# Patient Record
Sex: Male | Born: 1990 | Race: Black or African American | Hispanic: No | Smoking: Former smoker
Health system: Southern US, Community
[De-identification: ages and names within clinical notes are randomized; demographics above are authoritative.]

## PROBLEM LIST (undated history)

## (undated) DIAGNOSIS — J189 Pneumonia, unspecified organism: Secondary | ICD-10-CM

## (undated) DIAGNOSIS — E119 Type 2 diabetes mellitus without complications: Secondary | ICD-10-CM

---

## 2018-10-21 ENCOUNTER — Other Ambulatory Visit: Payer: Self-pay

## 2018-10-21 ENCOUNTER — Encounter (HOSPITAL_BASED_OUTPATIENT_CLINIC_OR_DEPARTMENT_OTHER): Payer: Self-pay | Admitting: Emergency Medicine

## 2018-10-21 ENCOUNTER — Emergency Department (HOSPITAL_BASED_OUTPATIENT_CLINIC_OR_DEPARTMENT_OTHER)
Admission: EM | Admit: 2018-10-21 | Discharge: 2018-10-21 | Disposition: A | Discharge status: Home / Self Care | Payer: Self-pay | Attending: Emergency Medicine | Admitting: Emergency Medicine

## 2018-10-21 DIAGNOSIS — F17290 Nicotine dependence, other tobacco product, uncomplicated: Secondary | ICD-10-CM | POA: Insufficient documentation

## 2018-10-21 DIAGNOSIS — L509 Urticaria, unspecified: Secondary | ICD-10-CM | POA: Insufficient documentation

## 2018-10-21 HISTORY — DX: Pneumonia, unspecified organism: J18.9

## 2018-10-21 MED ORDER — SODIUM CHLORIDE 0.9 % IV SOLN
1.0000 g | Freq: Once | INTRAVENOUS | Status: AC
  Administered 2018-10-21: 1 g via INTRAVENOUS
  Filled 2018-10-21: qty 10

## 2018-10-21 MED ORDER — FAMOTIDINE IN NACL 20-0.9 MG/50ML-% IV SOLN
INTRAVENOUS | Status: AC
  Filled 2018-10-21: qty 50

## 2018-10-21 MED ORDER — SODIUM CHLORIDE 0.9 % IV SOLN
INTRAVENOUS | Status: DC | PRN
  Administered 2018-10-21: 100 mL via INTRAVENOUS

## 2018-10-21 MED ORDER — DIPHENHYDRAMINE HCL 50 MG/ML IJ SOLN
INTRAMUSCULAR | Status: AC
  Filled 2018-10-21: qty 1

## 2018-10-21 MED ORDER — DIPHENHYDRAMINE HCL 50 MG/ML IJ SOLN
50.0000 mg | Freq: Once | INTRAMUSCULAR | Status: AC
  Administered 2018-10-21: 50 mg via INTRAVENOUS

## 2018-10-21 MED ORDER — FAMOTIDINE IN NACL 20-0.9 MG/50ML-% IV SOLN
20.0000 mg | Freq: Once | INTRAVENOUS | Status: AC
  Administered 2018-10-21: 20 mg via INTRAVENOUS

## 2018-10-21 MED ORDER — DIPHENHYDRAMINE HCL 25 MG PO TABS: 50.0000 mg | ORAL_TABLET | Freq: Four times a day (QID) | ORAL | 0 refills | Status: DC | PRN

## 2018-10-21 NOTE — ED Triage Notes (Signed)
Pt reports "bumps" to arms and feet onset tonight. Reports he was diagnosed with PNA today at [facility] Mentioned the same issue at that visit but was told it was hives. Gwenith Daily not filled his RX from today yet.

## 2018-10-21 NOTE — ED Provider Notes (Signed)
[facility] Provider Note: [name], MD, FACEP  CSN: [id number] MRN: [id number] ARRIVAL: 10/21/18 at 0124 ROOM: MH11/MH11   CHIEF COMPLAINT  Rash   HISTORY OF PRESENT ILLNESS  10/21/18 1:35 AM George Sanders is a 28 y.o. male who developed itching yesterday afternoon about 12:45 PM.  He subsequently developed an urticarial rash.  This is most prominent on his abdominal wall.  He was seen at [facility] ED yesterday for chest pain and sore throat.  CT scan showed 6 multifocal pneumonia and he was prescribed doxycycline.  He states he has not gotten that filled.  He asked about the rash and was told it was hives.  He said he was not treated for the hives.  He is now complaining also of swelling and pain in his hands and feet.   Past Medical History:  Diagnosis Date  . PNA (pneumonia)     History reviewed. No pertinent surgical history.  History reviewed. No pertinent family history.  Social History   Tobacco Use  . Smoking status: Current Every Day Smoker    Types: Cigars  . Smokeless tobacco: Never Used  Substance Use Topics  . Alcohol use: Yes  . Drug use: Not Currently    Prior to Admission medications   Medication Sig Start Date End Date Taking? Authorizing Provider  doxycycline (VIBRAMYCIN) 100 MG capsule Take by mouth. 10/20/18 10/27/18 Yes [provider]    Allergies Patient has no known allergies.   REVIEW OF SYSTEMS  Negative except as noted here or in the History of Present Illness.   PHYSICAL EXAMINATION  Initial Vital Signs Blood pressure (!) 164/88, pulse 84, temperature 99.3 F (37.4 C), temperature source Oral, SpO2 95 %.  Examination General: Well-developed, well-nourished male in no acute distress; appearance consistent with age of record HENT: normocephalic; atraumatic; no pharyngeal erythema or exudate Eyes: Normal appearance Neck: supple Heart: regular rate and rhythm Lungs: clear to auscultation  bilaterally Abdomen: soft; nondistended; nontender; bowel sounds present Extremities: No deformity; full range of motion; mild edema of hands and feet without erythema or warmth Neurologic: Awake, alert and oriented; motor function intact in all extremities and symmetric; no facial droop Skin: Warm and dry; urticarial rash primarily of abdomen Psychiatric: Normal mood and affect   RESULTS  Summary of this visit's results, reviewed by myself:   EKG Interpretation  Date/Time:    Ventricular Rate:    PR Interval:    QRS Duration:   QT Interval:    QTC Calculation:   R Axis:     Text Interpretation:        Laboratory Studies: No results found for this or any previous visit (from the past 24 hour(s)). Imaging Studies: No results found.  ED COURSE and MDM  Nursing notes and initial vitals signs, including pulse oximetry, reviewed.  Vitals:   10/21/18 0134 10/21/18 0137  BP: (!) 164/88   Pulse: 84   Resp: (!) 25   Temp: 99.3 F (37.4 C)   TempSrc: Oral   SpO2: 95%   Weight:  136.1 kg  Height:  6' (1.829 m)   3:25 AM Itching resolved, hives fading after IV Benadryl and Pepcid.  Patient given 1 g of Rocephin IV to initiate treatment of his earlier diagnosed community-acquired pneumonia.  He was encouraged to fill the doxycycline prescription.  PROCEDURES    ED DIAGNOSES     ICD-10-CM   1. Urticaria L50.9        [name],  [name], MD 10/21/18 [phone]

## 2018-10-21 NOTE — ED Notes (Signed)
Hives improved. Pt reports decrease in itching.

## 2019-04-29 ENCOUNTER — Inpatient Hospital Stay (HOSPITAL_BASED_OUTPATIENT_CLINIC_OR_DEPARTMENT_OTHER)
Admission: EM | Admit: 2019-04-29 | Discharge: 2019-05-02 | DRG: 638 | Disposition: A | Discharge status: Home / Self Care | Payer: Self-pay | Attending: Internal Medicine | Admitting: Internal Medicine

## 2019-04-29 ENCOUNTER — Encounter (HOSPITAL_BASED_OUTPATIENT_CLINIC_OR_DEPARTMENT_OTHER): Payer: Self-pay

## 2019-04-29 ENCOUNTER — Inpatient Hospital Stay (HOSPITAL_BASED_OUTPATIENT_CLINIC_OR_DEPARTMENT_OTHER): Payer: Self-pay

## 2019-04-29 ENCOUNTER — Emergency Department (HOSPITAL_BASED_OUTPATIENT_CLINIC_OR_DEPARTMENT_OTHER): Payer: Self-pay

## 2019-04-29 ENCOUNTER — Other Ambulatory Visit: Payer: Self-pay

## 2019-04-29 DIAGNOSIS — R9431 Abnormal electrocardiogram [ECG] [EKG]: Secondary | ICD-10-CM | POA: Diagnosis present

## 2019-04-29 DIAGNOSIS — R030 Elevated blood-pressure reading, without diagnosis of hypertension: Secondary | ICD-10-CM | POA: Diagnosis present

## 2019-04-29 DIAGNOSIS — Z833 Family history of diabetes mellitus: Secondary | ICD-10-CM

## 2019-04-29 DIAGNOSIS — R079 Chest pain, unspecified: Secondary | ICD-10-CM | POA: Diagnosis present

## 2019-04-29 DIAGNOSIS — K760 Fatty (change of) liver, not elsewhere classified: Secondary | ICD-10-CM | POA: Diagnosis present

## 2019-04-29 DIAGNOSIS — E1110 Type 2 diabetes mellitus with ketoacidosis without coma: Principal | ICD-10-CM | POA: Diagnosis present

## 2019-04-29 DIAGNOSIS — R7989 Other specified abnormal findings of blood chemistry: Secondary | ICD-10-CM | POA: Diagnosis present

## 2019-04-29 DIAGNOSIS — N5089 Other specified disorders of the male genital organs: Secondary | ICD-10-CM | POA: Diagnosis present

## 2019-04-29 DIAGNOSIS — E1310 Other specified diabetes mellitus with ketoacidosis without coma: Secondary | ICD-10-CM

## 2019-04-29 DIAGNOSIS — Z79899 Other long term (current) drug therapy: Secondary | ICD-10-CM

## 2019-04-29 DIAGNOSIS — K5900 Constipation, unspecified: Secondary | ICD-10-CM | POA: Diagnosis present

## 2019-04-29 DIAGNOSIS — R945 Abnormal results of liver function studies: Secondary | ICD-10-CM | POA: Diagnosis present

## 2019-04-29 DIAGNOSIS — I10 Essential (primary) hypertension: Secondary | ICD-10-CM | POA: Diagnosis present

## 2019-04-29 DIAGNOSIS — N492 Inflammatory disorders of scrotum: Secondary | ICD-10-CM | POA: Diagnosis present

## 2019-04-29 DIAGNOSIS — Z20828 Contact with and (suspected) exposure to other viral communicable diseases: Secondary | ICD-10-CM | POA: Diagnosis present

## 2019-04-29 DIAGNOSIS — Z6841 Body Mass Index (BMI) 40.0 and over, adult: Secondary | ICD-10-CM

## 2019-04-29 DIAGNOSIS — R109 Unspecified abdominal pain: Secondary | ICD-10-CM | POA: Diagnosis present

## 2019-04-29 DIAGNOSIS — Z87891 Personal history of nicotine dependence: Secondary | ICD-10-CM

## 2019-04-29 DIAGNOSIS — Z7982 Long term (current) use of aspirin: Secondary | ICD-10-CM

## 2019-04-29 DIAGNOSIS — E782 Mixed hyperlipidemia: Secondary | ICD-10-CM | POA: Diagnosis present

## 2019-04-29 DIAGNOSIS — E119 Type 2 diabetes mellitus without complications: Secondary | ICD-10-CM

## 2019-04-29 LAB — POCT I-STAT EG7
Acid-base deficit: 13 mmol/L — ABNORMAL HIGH (ref 0.0–2.0)
Bicarbonate: 13 mmol/L — ABNORMAL LOW (ref 20.0–28.0)
Calcium, Ion: 1.27 mmol/L (ref 1.15–1.40)
HCT: 49 % (ref 39.0–52.0)
Hemoglobin: 16.7 g/dL (ref 13.0–17.0)
O2 Saturation: 64 %
Potassium: 4.1 mmol/L (ref 3.5–5.1)
Sodium: 136 mmol/L (ref 135–145)
TCO2: 14 mmol/L — ABNORMAL LOW (ref 22–32)
pCO2, Ven: 31.5 mmHg — ABNORMAL LOW (ref 44.0–60.0)
pH, Ven: 7.225 — ABNORMAL LOW (ref 7.250–7.430)
pO2, Ven: 39 mmHg (ref 32.0–45.0)

## 2019-04-29 LAB — CBC WITH DIFFERENTIAL/PLATELET
Abs Immature Granulocytes: 0.03 10*3/uL (ref 0.00–0.07)
Basophils Absolute: 0 10*3/uL (ref 0.0–0.1)
Basophils Relative: 0 %
Eosinophils Absolute: 0 10*3/uL (ref 0.0–0.5)
Eosinophils Relative: 0 %
HCT: 48 % (ref 39.0–52.0)
Hemoglobin: 15 g/dL (ref 13.0–17.0)
Immature Granulocytes: 0 %
Lymphocytes Relative: 28 %
Lymphs Abs: 1.9 10*3/uL (ref 0.7–4.0)
MCH: 26.8 pg (ref 26.0–34.0)
MCHC: 31.3 g/dL (ref 30.0–36.0)
MCV: 85.7 fL (ref 80.0–100.0)
Monocytes Absolute: 0.6 10*3/uL (ref 0.1–1.0)
Monocytes Relative: 8 %
Neutro Abs: 4.3 10*3/uL (ref 1.7–7.7)
Neutrophils Relative %: 64 %
Platelets: 200 10*3/uL (ref 150–400)
RBC: 5.6 MIL/uL (ref 4.22–5.81)
RDW: 14.5 % (ref 11.5–15.5)
WBC: 6.9 10*3/uL (ref 4.0–10.5)
nRBC: 0 % (ref 0.0–0.2)

## 2019-04-29 LAB — COMPREHENSIVE METABOLIC PANEL
ALT: 59 U/L — ABNORMAL HIGH (ref 0–44)
AST: 71 U/L — ABNORMAL HIGH (ref 15–41)
Albumin: 4.5 g/dL (ref 3.5–5.0)
Alkaline Phosphatase: 68 U/L (ref 38–126)
Anion gap: 19 — ABNORMAL HIGH (ref 5–15)
BUN: 8 mg/dL (ref 6–20)
CO2: 12 mmol/L — ABNORMAL LOW (ref 22–32)
Calcium: 9.4 mg/dL (ref 8.9–10.3)
Chloride: 101 mmol/L (ref 98–111)
Creatinine, Ser: 1.02 mg/dL (ref 0.61–1.24)
GFR calc Af Amer: >60 mL/min (ref >60)
GFR calc non Af Amer: >60 mL/min (ref >60)
Glucose, Bld: 314 mg/dL — ABNORMAL HIGH (ref 70–99)
Potassium: 4.4 mmol/L (ref 3.5–5.1)
Sodium: 132 mmol/L — ABNORMAL LOW (ref 135–145)
Total Bilirubin: 1.2 mg/dL (ref 0.3–1.2)
Total Protein: 9.4 g/dL — ABNORMAL HIGH (ref 6.5–8.1)

## 2019-04-29 LAB — CBG MONITORING, ED
Glucose-Capillary: 244 mg/dL — ABNORMAL HIGH (ref 70–99)
Glucose-Capillary: 252 mg/dL — ABNORMAL HIGH (ref 70–99)
Glucose-Capillary: 259 mg/dL — ABNORMAL HIGH (ref 70–99)
Glucose-Capillary: 264 mg/dL — ABNORMAL HIGH (ref 70–99)
Glucose-Capillary: 320 mg/dL — ABNORMAL HIGH (ref 70–99)

## 2019-04-29 LAB — LIPASE, BLOOD: Lipase: 22 U/L (ref 11–51)

## 2019-04-29 LAB — LACTIC ACID, PLASMA
Lactic Acid, Venous: 1.6 mmol/L (ref 0.5–1.9)
Lactic Acid, Venous: 1.7 mmol/L (ref 0.5–1.9)

## 2019-04-29 LAB — SARS CORONAVIRUS 2 BY RT PCR (HOSPITAL ORDER, PERFORMED IN ~~LOC~~ HOSPITAL LAB): SARS Coronavirus 2: NEGATIVE

## 2019-04-29 MED ORDER — CLINDAMYCIN PHOSPHATE 600 MG/50ML IV SOLN
600.0000 mg | Freq: Once | INTRAVENOUS | Status: AC
  Administered 2019-04-29: 600 mg via INTRAVENOUS
  Filled 2019-04-29: qty 50

## 2019-04-29 MED ORDER — POTASSIUM CHLORIDE 10 MEQ/100ML IV SOLN
10.0000 meq | INTRAVENOUS | Status: AC
  Administered 2019-04-29 (×2): 10 meq via INTRAVENOUS
  Filled 2019-04-29: qty 100

## 2019-04-29 MED ORDER — SODIUM CHLORIDE 0.9 % IV SOLN
INTRAVENOUS | Status: DC
  Administered 2019-04-29: 20:00:00 via INTRAVENOUS

## 2019-04-29 MED ORDER — ONDANSETRON HCL 4 MG/2ML IJ SOLN
4.0000 mg | Freq: Once | INTRAMUSCULAR | Status: AC
  Administered 2019-04-29: 4 mg via INTRAVENOUS
  Filled 2019-04-29: qty 2

## 2019-04-29 MED ORDER — SODIUM CHLORIDE 0.9 % IV BOLUS
1000.0000 mL | Freq: Once | INTRAVENOUS | Status: AC
  Administered 2019-04-30: 1000 mL via INTRAVENOUS

## 2019-04-29 MED ORDER — INSULIN REGULAR(HUMAN) IN NACL 100-0.9 UT/100ML-% IV SOLN
INTRAVENOUS | Status: DC
  Administered 2019-04-29: 2 [IU]/h via INTRAVENOUS
  Administered 2019-04-30: 11.7 [IU]/h via INTRAVENOUS
  Filled 2019-04-29 (×3): qty 100

## 2019-04-29 MED ORDER — FENTANYL CITRATE (PF) 100 MCG/2ML IJ SOLN
100.0000 ug | Freq: Once | INTRAMUSCULAR | Status: AC
  Administered 2019-04-29: 100 ug via INTRAVENOUS
  Filled 2019-04-29: qty 2

## 2019-04-29 MED ORDER — SODIUM CHLORIDE 0.9 % IV BOLUS
1000.0000 mL | Freq: Once | INTRAVENOUS | Status: AC
  Administered 2019-04-29: 1000 mL via INTRAVENOUS

## 2019-04-29 NOTE — ED Triage Notes (Addendum)
Pt c/o constipation-last BM 4 days ago--c/o weakness and HA x 4-5 days-also c/o "cyst to my genital area" x 2 days-NAD-to triage in w/c

## 2019-04-29 NOTE — Care Management (Signed)
This is a no charge note  Transfer from [facility] per Dr. Rex Kras  28 year old man without significant past medical history, who presents with nausea, vomiting, upper abdominal pain, generalized weakness, constipation, headache.  Found to have new onset of diabetes and DKA with blood sugar 314, bicarbonate 12, anion gap of 19.  Insulin drip was started.  Patient also has scrotum abscess which is actively draining.  Patient was given 1 dose of clindamycin.  VBG showed a pH of 7.225, CO2 31.5, O2 39.  Abnormal liver function (ALP 68, AST 71, ALT 59, total bilirubin 1.2).  lactic acid 1.6, WBC 6.7, pending COVID-19 test, creatinine 1.02, BUN 8, temperature normal, blood pressure 118/74, heart rate 126, RR 24, oxygen saturation 99% on room air.  Scrotum ultrasound and right upper quadrant ultrasound were ordered by EDP, pending results. Pt is admitted to SDU as inpt.   Please call manager of [facility] at [phone] when pt arrives to floor   Ivor Costa, MD  [facility]   If 7PM-7AM, please contact night-coverage [email or web address] Password [facility] 04/29/2019, 7:57 PM

## 2019-04-29 NOTE — ED Provider Notes (Signed)
[facility] EMERGENCY DEPARTMENT Provider Note   CSN: [id number] Arrival date & time: 04/29/19  1625     History   Chief Complaint Chief Complaint  Patient presents with  . Multiple c/o    HPI George Sanders. is a 28 y.o. male.     28yo M w/ h/o obesity who p/w multiple complaints. Pt reports 4 days of central abd pain, constant. Improves w/ gingerale. He has had N/V, last vomiting 2 days ago. He reports constipation with last BM 4 days ago. He reports headache and feeling weak x 4-5 days. No urinary symptoms, fever, cough/cold symptoms, or sick contacts. Of note, his mom checked his BG at home and it was high in the 300s so she gave him unknown amount of unknown type of insulin. He denies having diagnosis of diabetes.  He does admit to polyuria and polydipsia.     Past Medical History:  Diagnosis Date  . PNA (pneumonia)     Patient Active Problem List   Diagnosis Date Noted  . DKA (diabetic ketoacidoses) (HCC) 04/29/2019  . New onset type 2 diabetes mellitus (HCC) 04/29/2019  . Abscess of scrotum 04/29/2019  . Abnormal LFTs 04/29/2019  . Abdominal pain 04/29/2019    History reviewed. No pertinent surgical history.      Home Medications    Prior to Admission medications   Medication Sig Start Date End Date Taking? Authorizing Provider  diphenhydrAMINE (BENADRYL) 25 MG tablet Take 2 tablets (50 mg total) by mouth every 6 (six) hours as needed for itching. 10/21/18   Molpus, John, MD    Family History No family history on file.  Social History Social History   Tobacco Use  . Smoking status: Former Smoker    Types: Cigars  . Smokeless tobacco: Never Used  Substance Use Topics  . Alcohol use: Yes    Comment: occ  . Drug use: Never     Allergies   Patient has no known allergies.   Review of Systems Review of Systems All other systems reviewed and are negative except that which was mentioned in HPI   Physical Exam Updated Vital Signs  BP 131/89   Pulse (!) 125   Temp 98.9 F (37.2 C) (Oral)   Resp (!) 21   Ht 6' (1.829 m)   Wt (!) 167.4 kg   SpO2 96%   BMI 50.05 kg/m   Physical Exam Vitals signs and nursing note reviewed. Exam conducted with a chaperone present.  Constitutional:      General: He is not in acute distress.    Appearance: He is well-developed.     Comments: Uncomfortable, in mild distress  HENT:     Head: Normocephalic and atraumatic.  Eyes:     Conjunctiva/sclera: Conjunctivae normal.     Pupils: Pupils are equal, round, and reactive to light.  Neck:     Musculoskeletal: Neck supple.  Cardiovascular:     Rate and Rhythm: Regular rhythm. Tachycardia present.     Heart sounds: Normal heart sounds. No murmur.  Pulmonary:     Effort: Pulmonary effort is normal.     Breath sounds: Normal breath sounds.  Abdominal:     General: Bowel sounds are normal. There is no distension.     Palpations: Abdomen is soft.     Tenderness: There is abdominal tenderness. There is no guarding or rebound.     Comments: LUQ, midepigastric, and RUQ tenderness  Genitourinary:    Comments: L scrotal wall with  small area of induration, central purulent drainage, no crepitus or skin changes Skin:    General: Skin is warm and dry.  Neurological:     Mental Status: He is alert and oriented to person, place, and time.     Comments: Fluent speech  Psychiatric:        Judgment: Judgment normal.      ED Treatments / Results  Labs (all labs ordered are listed, but only abnormal results are displayed) Labs Reviewed  COMPREHENSIVE METABOLIC PANEL - Abnormal; Notable for the following components:      Result Value   Sodium 132 (*)    CO2 12 (*)    Glucose, Bld 314 (*)    Total Protein 9.4 (*)    AST 71 (*)    ALT 59 (*)    Anion gap 19 (*)    All other components within normal limits  CBG MONITORING, ED - Abnormal; Notable for the following components:   Glucose-Capillary 320 (*)    All other components  within normal limits  POCT I-STAT EG7 - Abnormal; Notable for the following components:   pH, Ven 7.225 (*)    pCO2, Ven 31.5 (*)    Bicarbonate 13.0 (*)    TCO2 14 (*)    Acid-base deficit 13.0 (*)    All other components within normal limits  CBG MONITORING, ED - Abnormal; Notable for the following components:   Glucose-Capillary 259 (*)    All other components within normal limits  CBG MONITORING, ED - Abnormal; Notable for the following components:   Glucose-Capillary 252 (*)    All other components within normal limits  CBG MONITORING, ED - Abnormal; Notable for the following components:   Glucose-Capillary 244 (*)    All other components within normal limits  SARS CORONAVIRUS 2 (HOSPITAL ORDER, PERFORMED IN [facility])  CBC WITH DIFFERENTIAL/PLATELET  LIPASE, BLOOD  LACTIC ACID, PLASMA  LACTIC ACID, PLASMA  URINALYSIS, ROUTINE W REFLEX MICROSCOPIC  I-STAT VENOUS BLOOD GAS, ED    EKG EKG Interpretation  Date/Time:  Tuesday April 29 2019 19:22:18 EDT Ventricular Rate:  116 PR Interval:    QRS Duration: 86 QT Interval:  332 QTC Calculation: 462 R Axis:   56 Text Interpretation:  Sinus tachycardia Baseline wander in lead(s) V2 V3 No previous ECGs available Confirmed by Frederick PeersLittle, Jordie Skalsky [phone]([id number]) on 04/29/2019 7:48:40 PM   Radiology [place] Abdomen Limited  Result Date: 04/29/2019 CLINICAL DATA:  28 year old male with RIGHT UPPER quadrant abdominal pain for 4 days. EXAM: ULTRASOUND ABDOMEN LIMITED RIGHT UPPER QUADRANT COMPARISON:  None. FINDINGS: Gallbladder: Gallbladder is unremarkable. There is no evidence of cholelithiasis or acute cholecystitis. Common bile duct: Diameter: 4 mm.  No intrahepatic or extrahepatic biliary dilatation. Liver: Diffuse liver is compatible increased echogenicity with hepatic steatosis of the. No definite focal hepatic abnormalities are noted. Portal vein is patent on color Doppler imaging with normal direction of blood flow towards the  liver. Other: None. IMPRESSION: 1. No acute abnormality.  Normal gallbladder. 2. Hepatic steatosis Electronically Signed   By: Harmon PierJeffrey  Hu M.D.   On: 04/29/2019 21:18   [place] Scrotum W/doppler  Result Date: 04/29/2019 CLINICAL DATA:  Actively draining left scrotum. EXAM: SCROTAL ULTRASOUND DOPPLER ULTRASOUND OF THE TESTICLES TECHNIQUE: Complete ultrasound examination of the testicles, epididymis, and other scrotal structures was performed. Color and spectral Doppler ultrasound were also utilized to evaluate blood flow to the testicles. COMPARISON:  None. FINDINGS: Right testicle Measurements: 4.2 x 2.5 x 1.9 cm.  No mass or microlithiasis visualized. Left testicle Measurements: 4.7 x 2.8 x 2.0 cm. No mass or microlithiasis visualized. Right epididymis:  Normal in size and appearance. Left epididymis:  Normal in size and appearance. Hydrocele:  None visualized. Varicocele:  None visualized. Pulsed Doppler interrogation of both testes demonstrates normal low resistance arterial and venous waveforms bilaterally. Ill-defined hypoechoic area is seen involving the left scrotal wall concerning for small abscess. IMPRESSION: No evidence of testicular mass or torsion. Ill-defined hypoechoic area is seen in left scrotal wall concerning for small abscess. Electronically Signed   By: Marijo Conception M.D.   On: 04/29/2019 21:28    Procedures .Critical Care Performed by: Sharlett Iles, MD Authorized by: Sharlett Iles, MD   Critical care provider statement:    Critical care time (minutes):  35   Critical care time was exclusive of:  Separately billable procedures and treating other patients   Critical care was necessary to treat or prevent imminent or life-threatening deterioration of the following conditions:  Endocrine crisis   Critical care was time spent personally by me on the following activities:  Development of treatment plan with patient or surrogate, evaluation of patient's response to  treatment, examination of patient, obtaining history from patient or surrogate, ordering and performing treatments and interventions, ordering and review of laboratory studies, ordering and review of radiographic studies and re-evaluation of patient's condition   (including critical care time)  Medications Ordered in ED Medications  insulin regular, human (MYXREDLIN) 100 units/ 100 mL infusion (5.5 Units/hr Intravenous Rate/Dose Change 04/29/19 2228)  sodium chloride 0.9 % bolus 1,000 mL ( Intravenous Stopped 04/29/19 2154)    And  0.9 %  sodium chloride infusion ( Intravenous New Bag/Given 04/29/19 2008)  ondansetron (ZOFRAN) injection 4 mg (4 mg Intravenous Given 04/29/19 1852)  fentaNYL (SUBLIMAZE) injection 100 mcg (100 mcg Intravenous Given 04/29/19 1854)  sodium chloride 0.9 % bolus 1,000 mL (0 mLs Intravenous Stopped 04/29/19 2006)  clindamycin (CLEOCIN) IVPB 600 mg (0 mg Intravenous Stopped 04/29/19 1936)  potassium chloride 10 mEq in 100 mL IVPB ( Intravenous Stopped 04/29/19 2154)     Initial Impression / Assessment and Plan / ED Course  I have reviewed the triage vital signs and the nursing notes.  Pertinent labs & imaging results that were available during my care of the patient were reviewed by me and considered in my medical decision making (see chart for details).       Patient was tachycardic on arrival but afebrile.  Draining scrotal abscess on exam which may be contributing to hyperglycemia.  Labs show DKA with venous pH 7.22, bicarb 13, glucose 314, anion gap 19, normal lactate.  Gave the patient IV clindamycin and obtained ultrasound of scrotum to rule out significant abscess.  Gated to DKA protocol with insulin, IV fluids, and potassium repletion.  I discussed admission with [facility], Dr. Blaine Hamper, who has accepted the patient in transfer to [facility] long before admission.  I have ordered right upper quadrant ultrasound given his upper abdominal pain to rule out gallbladder  pathology. Final Clinical Impressions(s) / ED Diagnoses   Final diagnoses:  Diabetic ketoacidosis without coma associated with other specified diabetes mellitus ([place])  Scrotal abscess    ED Discharge Orders    None       Stoney Karczewski, Wenda Overland, MD 04/29/19 2339

## 2019-04-30 ENCOUNTER — Encounter (HOSPITAL_COMMUNITY): Payer: Self-pay | Admitting: Internal Medicine

## 2019-04-30 ENCOUNTER — Inpatient Hospital Stay (HOSPITAL_COMMUNITY): Payer: Self-pay

## 2019-04-30 DIAGNOSIS — E1010 Type 1 diabetes mellitus with ketoacidosis without coma: Secondary | ICD-10-CM

## 2019-04-30 DIAGNOSIS — E119 Type 2 diabetes mellitus without complications: Secondary | ICD-10-CM

## 2019-04-30 DIAGNOSIS — R109 Unspecified abdominal pain: Secondary | ICD-10-CM

## 2019-04-30 DIAGNOSIS — R079 Chest pain, unspecified: Secondary | ICD-10-CM | POA: Diagnosis present

## 2019-04-30 DIAGNOSIS — R072 Precordial pain: Secondary | ICD-10-CM

## 2019-04-30 DIAGNOSIS — E1110 Type 2 diabetes mellitus with ketoacidosis without coma: Principal | ICD-10-CM

## 2019-04-30 DIAGNOSIS — N492 Inflammatory disorders of scrotum: Secondary | ICD-10-CM

## 2019-04-30 DIAGNOSIS — R030 Elevated blood-pressure reading, without diagnosis of hypertension: Secondary | ICD-10-CM

## 2019-04-30 DIAGNOSIS — E1310 Other specified diabetes mellitus with ketoacidosis without coma: Secondary | ICD-10-CM

## 2019-04-30 LAB — HEPATITIS PANEL, ACUTE
HCV Ab: NONREACTIVE
Hep A IgM: NONREACTIVE
Hep B C IgM: NONREACTIVE
Hepatitis B Surface Ag: NONREACTIVE

## 2019-04-30 LAB — BASIC METABOLIC PANEL
Anion gap: 16 — ABNORMAL HIGH (ref 5–15)
Anion gap: 19 — ABNORMAL HIGH (ref 5–15)
Anion gap: 14 (ref 5–15)
Anion gap: 10 (ref 5–15)
BUN: 6 mg/dL (ref 6–20)
BUN: 7 mg/dL (ref 6–20)
BUN: 5 mg/dL — ABNORMAL LOW (ref 6–20)
BUN: 5 mg/dL — ABNORMAL LOW (ref 6–20)
CO2: 12 mmol/L — ABNORMAL LOW (ref 22–32)
CO2: 9 mmol/L — ABNORMAL LOW (ref 22–32)
CO2: 12 mmol/L — ABNORMAL LOW (ref 22–32)
CO2: 15 mmol/L — ABNORMAL LOW (ref 22–32)
Calcium: 8.8 mg/dL — ABNORMAL LOW (ref 8.9–10.3)
Calcium: 8.8 mg/dL — ABNORMAL LOW (ref 8.9–10.3)
Calcium: 8.6 mg/dL — ABNORMAL LOW (ref 8.9–10.3)
Calcium: 8.5 mg/dL — ABNORMAL LOW (ref 8.9–10.3)
Chloride: 108 mmol/L (ref 98–111)
Chloride: 109 mmol/L (ref 98–111)
Chloride: 109 mmol/L (ref 98–111)
Chloride: 108 mmol/L (ref 98–111)
Creatinine, Ser: 0.93 mg/dL (ref 0.61–1.24)
Creatinine, Ser: 0.95 mg/dL (ref 0.61–1.24)
Creatinine, Ser: 0.82 mg/dL (ref 0.61–1.24)
Creatinine, Ser: 0.74 mg/dL (ref 0.61–1.24)
GFR calc Af Amer: >60 mL/min (ref >60)
GFR calc Af Amer: >60 mL/min (ref >60)
GFR calc Af Amer: >60 mL/min (ref >60)
GFR calc Af Amer: >60 mL/min (ref >60)
GFR calc non Af Amer: >60 mL/min (ref >60)
GFR calc non Af Amer: >60 mL/min (ref >60)
GFR calc non Af Amer: >60 mL/min (ref >60)
GFR calc non Af Amer: >60 mL/min (ref >60)
Glucose, Bld: 226 mg/dL — ABNORMAL HIGH (ref 70–99)
Glucose, Bld: 229 mg/dL — ABNORMAL HIGH (ref 70–99)
Glucose, Bld: 156 mg/dL — ABNORMAL HIGH (ref 70–99)
Glucose, Bld: 180 mg/dL — ABNORMAL HIGH (ref 70–99)
Potassium: 3.8 mmol/L (ref 3.5–5.1)
Potassium: 3.9 mmol/L (ref 3.5–5.1)
Potassium: 3.2 mmol/L — ABNORMAL LOW (ref 3.5–5.1)
Potassium: 3.1 mmol/L — ABNORMAL LOW (ref 3.5–5.1)
Sodium: 136 mmol/L (ref 135–145)
Sodium: 137 mmol/L (ref 135–145)
Sodium: 135 mmol/L (ref 135–145)
Sodium: 133 mmol/L — ABNORMAL LOW (ref 135–145)

## 2019-04-30 LAB — URINALYSIS, ROUTINE W REFLEX MICROSCOPIC
Bilirubin Urine: NEGATIVE
Glucose, UA: >=500 mg/dL — AB
Ketones, ur: 80 mg/dL — AB
Leukocytes,Ua: NEGATIVE
Nitrite: NEGATIVE
Protein, ur: 100 mg/dL — AB
Specific Gravity, Urine: 1.022 (ref 1.005–1.030)
pH: 5 (ref 5.0–8.0)

## 2019-04-30 LAB — GLUCOSE, CAPILLARY
Glucose-Capillary: 234 mg/dL — ABNORMAL HIGH (ref 70–99)
Glucose-Capillary: 248 mg/dL — ABNORMAL HIGH (ref 70–99)
Glucose-Capillary: 190 mg/dL — ABNORMAL HIGH (ref 70–99)
Glucose-Capillary: 213 mg/dL — ABNORMAL HIGH (ref 70–99)
Glucose-Capillary: 182 mg/dL — ABNORMAL HIGH (ref 70–99)
Glucose-Capillary: 171 mg/dL — ABNORMAL HIGH (ref 70–99)
Glucose-Capillary: 148 mg/dL — ABNORMAL HIGH (ref 70–99)
Glucose-Capillary: 173 mg/dL — ABNORMAL HIGH (ref 70–99)
Glucose-Capillary: 146 mg/dL — ABNORMAL HIGH (ref 70–99)
Glucose-Capillary: 150 mg/dL — ABNORMAL HIGH (ref 70–99)
Glucose-Capillary: 163 mg/dL — ABNORMAL HIGH (ref 70–99)
Glucose-Capillary: 159 mg/dL — ABNORMAL HIGH (ref 70–99)
Glucose-Capillary: 156 mg/dL — ABNORMAL HIGH (ref 70–99)
Glucose-Capillary: 162 mg/dL — ABNORMAL HIGH (ref 70–99)

## 2019-04-30 LAB — LIPID PANEL
Cholesterol: 221 mg/dL — ABNORMAL HIGH (ref 0–200)
HDL: 38 mg/dL — ABNORMAL LOW (ref >40)
LDL Cholesterol: 148 mg/dL — ABNORMAL HIGH (ref 0–99)
Total CHOL/HDL Ratio: 5.8 RATIO
Triglycerides: 175 mg/dL — ABNORMAL HIGH (ref <150)
VLDL: 35 mg/dL (ref 0–40)

## 2019-04-30 LAB — HEMOGLOBIN A1C
Hgb A1c MFr Bld: 11.5 % — ABNORMAL HIGH (ref 4.8–5.6)
Mean Plasma Glucose: 283.35 mg/dL

## 2019-04-30 LAB — RAPID URINE DRUG SCREEN, HOSP PERFORMED
Amphetamines: NOT DETECTED
Barbiturates: NOT DETECTED
Benzodiazepines: NOT DETECTED
Cocaine: NOT DETECTED
Opiates: NOT DETECTED
Tetrahydrocannabinol: NOT DETECTED

## 2019-04-30 LAB — TROPONIN I (HIGH SENSITIVITY)
Troponin I (High Sensitivity): 3 ng/L (ref <18)
Troponin I (High Sensitivity): 3 ng/L (ref <18)
Troponin I (High Sensitivity): 3 ng/L (ref <18)

## 2019-04-30 LAB — MRSA PCR SCREENING: MRSA by PCR: NEGATIVE

## 2019-04-30 LAB — CBG MONITORING, ED
Glucose-Capillary: 206 mg/dL — ABNORMAL HIGH (ref 70–99)
Glucose-Capillary: 257 mg/dL — ABNORMAL HIGH (ref 70–99)

## 2019-04-30 LAB — HIV ANTIBODY (ROUTINE TESTING W REFLEX): HIV Screen 4th Generation wRfx: NONREACTIVE

## 2019-04-30 MED ORDER — ONDANSETRON HCL 4 MG/2ML IJ SOLN: 4.0000 mg | Freq: Three times a day (TID) | INTRAMUSCULAR | Status: DC | PRN

## 2019-04-30 MED ORDER — POTASSIUM CHLORIDE 10 MEQ/100ML IV SOLN
INTRAVENOUS | Status: AC
  Administered 2019-04-30: 10 meq via INTRAVENOUS
  Filled 2019-04-30: qty 100

## 2019-04-30 MED ORDER — SODIUM CHLORIDE 0.9 % IV SOLN: INTRAVENOUS | Status: DC

## 2019-04-30 MED ORDER — DIPHENHYDRAMINE HCL 50 MG PO CAPS: 50.0000 mg | ORAL_CAPSULE | Freq: Four times a day (QID) | ORAL | Status: DC | PRN

## 2019-04-30 MED ORDER — INSULIN GLARGINE 100 UNIT/ML ~~LOC~~ SOLN
30.0000 [IU] | Freq: Every day | SUBCUTANEOUS | Status: DC
  Administered 2019-04-30: 30 [IU] via SUBCUTANEOUS
  Filled 2019-04-30 (×2): qty 0.3

## 2019-04-30 MED ORDER — DEXTROSE-NACL 5-0.45 % IV SOLN
INTRAVENOUS | Status: DC
  Administered 2019-04-30 (×2): via INTRAVENOUS

## 2019-04-30 MED ORDER — INSULIN STARTER KIT- PEN NEEDLES (ENGLISH)
1.0000 | Freq: Once | Status: AC
  Administered 2019-04-30: 1
  Filled 2019-04-30: qty 1

## 2019-04-30 MED ORDER — POTASSIUM CHLORIDE CRYS ER 20 MEQ PO TBCR
40.0000 meq | EXTENDED_RELEASE_TABLET | Freq: Once | ORAL | Status: AC
  Administered 2019-04-30: 40 meq via ORAL
  Filled 2019-04-30: qty 2

## 2019-04-30 MED ORDER — INSULIN ASPART 100 UNIT/ML ~~LOC~~ SOLN: 4.0000 [IU] | Freq: Three times a day (TID) | SUBCUTANEOUS | Status: DC

## 2019-04-30 MED ORDER — ENOXAPARIN SODIUM 40 MG/0.4ML ~~LOC~~ SOLN
40.0000 mg | Freq: Every day | SUBCUTANEOUS | Status: DC
  Administered 2019-04-30 – 2019-05-01 (×2): 40 mg via SUBCUTANEOUS
  Filled 2019-04-30 (×2): qty 0.4

## 2019-04-30 MED ORDER — ASPIRIN EC 81 MG PO TBEC
81.0000 mg | DELAYED_RELEASE_TABLET | Freq: Every day | ORAL | Status: DC
  Administered 2019-05-01 – 2019-05-02 (×2): 81 mg via ORAL
  Filled 2019-04-30 (×3): qty 1

## 2019-04-30 MED ORDER — DEXTROSE-NACL 5-0.45 % IV SOLN
INTRAVENOUS | Status: DC
  Administered 2019-04-30: 02:00:00 via INTRAVENOUS

## 2019-04-30 MED ORDER — ONDANSETRON HCL 4 MG/2ML IJ SOLN
4.0000 mg | Freq: Four times a day (QID) | INTRAMUSCULAR | Status: DC | PRN
  Administered 2019-04-30: 4 mg via INTRAVENOUS
  Filled 2019-04-30: qty 2

## 2019-04-30 MED ORDER — ORAL CARE MOUTH RINSE
15.0000 mL | Freq: Two times a day (BID) | OROMUCOSAL | Status: DC
  Administered 2019-04-30 – 2019-05-02 (×4): 15 mL via OROMUCOSAL

## 2019-04-30 MED ORDER — SODIUM CHLORIDE 0.9 % IV SOLN
INTRAVENOUS | Status: DC
  Administered 2019-04-30: 22:00:00 via INTRAVENOUS

## 2019-04-30 MED ORDER — SENNOSIDES-DOCUSATE SODIUM 8.6-50 MG PO TABS
2.0000 | ORAL_TABLET | Freq: Two times a day (BID) | ORAL | Status: DC
  Administered 2019-04-30 – 2019-05-01 (×3): 2 via ORAL
  Filled 2019-04-30 (×4): qty 2

## 2019-04-30 MED ORDER — NITROGLYCERIN 0.4 MG SL SUBL: 0.4000 mg | SUBLINGUAL_TABLET | SUBLINGUAL | Status: DC | PRN

## 2019-04-30 MED ORDER — INSULIN ASPART 100 UNIT/ML ~~LOC~~ SOLN
0.0000 [IU] | SUBCUTANEOUS | Status: DC
  Administered 2019-04-30 – 2019-05-01 (×2): 5 [IU] via SUBCUTANEOUS

## 2019-04-30 MED ORDER — HYDROXYZINE HCL 10 MG PO TABS: 10.0000 mg | ORAL_TABLET | Freq: Three times a day (TID) | ORAL | Status: DC | PRN

## 2019-04-30 MED ORDER — HYDRALAZINE HCL 25 MG PO TABS
25.0000 mg | ORAL_TABLET | Freq: Three times a day (TID) | ORAL | Status: DC | PRN
  Administered 2019-04-30 – 2019-05-01 (×3): 25 mg via ORAL
  Filled 2019-04-30 (×3): qty 1

## 2019-04-30 MED ORDER — SENNOSIDES-DOCUSATE SODIUM 8.6-50 MG PO TABS: 1.0000 | ORAL_TABLET | Freq: Every evening | ORAL | Status: DC | PRN

## 2019-04-30 MED ORDER — IOHEXOL 300 MG/ML  SOLN
100.0000 mL | Freq: Once | INTRAMUSCULAR | Status: AC | PRN
  Administered 2019-04-30: 100 mL via INTRAVENOUS

## 2019-04-30 MED ORDER — MORPHINE SULFATE (PF) 2 MG/ML IV SOLN
2.0000 mg | INTRAVENOUS | Status: DC | PRN
  Administered 2019-04-30: 2 mg via INTRAVENOUS
  Filled 2019-04-30: qty 1

## 2019-04-30 MED ORDER — CLINDAMYCIN PHOSPHATE 600 MG/50ML IV SOLN
600.0000 mg | Freq: Three times a day (TID) | INTRAVENOUS | Status: DC
  Administered 2019-04-30 – 2019-05-02 (×7): 600 mg via INTRAVENOUS
  Filled 2019-04-30 (×7): qty 50

## 2019-04-30 MED ORDER — POLYETHYLENE GLYCOL 3350 17 G PO PACK: 17.0000 g | PACK | Freq: Every day | ORAL | Status: DC

## 2019-04-30 MED ORDER — POTASSIUM CHLORIDE 10 MEQ/100ML IV SOLN
10.0000 meq | INTRAVENOUS | Status: DC
  Administered 2019-04-30 (×2): 10 meq via INTRAVENOUS
  Filled 2019-04-30: qty 100

## 2019-04-30 MED ORDER — SODIUM CHLORIDE 0.9 % IV BOLUS
1000.0000 mL | Freq: Once | INTRAVENOUS | Status: AC
  Administered 2019-04-30: 06:00:00 1000 mL via INTRAVENOUS

## 2019-04-30 MED ORDER — CHLORHEXIDINE GLUCONATE CLOTH 2 % EX PADS
6.0000 | MEDICATED_PAD | Freq: Every day | CUTANEOUS | Status: DC
  Administered 2019-04-30: 6 via TOPICAL

## 2019-04-30 MED ORDER — LIVING WELL WITH DIABETES BOOK
Freq: Once | Status: AC
  Administered 2019-04-30: 15:00:00
  Filled 2019-04-30: qty 1

## 2019-04-30 MED ORDER — IOHEXOL 300 MG/ML  SOLN
30.0000 mL | Freq: Once | INTRAMUSCULAR | Status: AC | PRN
  Administered 2019-04-30: 30 mL via ORAL

## 2019-04-30 MED ORDER — POLYETHYLENE GLYCOL 3350 17 G PO PACK
17.0000 g | PACK | Freq: Two times a day (BID) | ORAL | Status: DC
  Administered 2019-04-30 – 2019-05-01 (×3): 17 g via ORAL
  Filled 2019-04-30 (×4): qty 1

## 2019-04-30 NOTE — Consult Note (Addendum)
Cardiology Consultation:   Patient ID: George Manard.; [id number]; 20-Apr-1991   Admit date: 04/29/2019 Date of Consult: 04/30/2019  Primary Care Provider: System, Provider Not In Primary Cardiologist: New to [place]; Dr. Sallyanne Kuster Primary Electrophysiologist:  None   Patient Profile:   George Plate. is a 28 y.o. male with no significant PMH other than obesity, admitted for DKA and new onset DM type 2, who is being seen today for the evaluation of chest pain at the request of Dr. Blaine Hamper.  History of Present Illness:   George Sanders was in his usual state of health until 4 days ago when he developed abdominal pain, nausea, vomiting, constipation, and generalized weakness. He subsequently developed central chest pain radiating to his throat which he described as a burning sensation. He reports relief of symptoms with belching and vomiting. No associated SOB, diaphoresis, dizziness, lightheadedness, or syncope. ROS notable for polyuria, polydipsia, and headaches. No complaints of fever, SOB, LE edema, orthopnea, PND, or palpitations.  He has no prior cardiac history and has never undergone cardiac testing in the past. He reports family history of CAD in his mother who had an MI with stent placement, likely before age 20 but he is not sure of her age of onset, who also has DM. Sister also with DM type 2 and father has HTN. He reports that he quit smoking 2 weeks ago; previously smoking a few black and milds a day. He reports occasional ETOH use for holidays/birthdays. He denies recreational drug use.  At the time of this evaluation he reports abdominal pain but no significant chest pain. Prior to this, he reports exercising regularly by walking 1 mile most days and doing an exercise app. He denies any chest pain or SOB with these activities. He is planning to start back at school soon, studying psychology.    Hospital course: tachycardic to the 120s, BP generally elevated, intermittent  tachypnea, otherwise VSS. Labs notable for Na 132>136 (after IVFs), K 3.9, glucose 314>229, Cr 0.95, anion gap 19>16, AST/ALT mildly elevated, CBC wnl, Lipid panel with Tcholesterol 221, HDL 38, LDL 175, triglycerides 175, HsTrop 3. HIV, hepatitis panel, HgbA1C, and Utox pending. CXR without acute findings. Abdominal US with hepatic steatosis but no acute findings. Scrotal US with small abscess in left scrotal wall. CT A/P ordered. Initial EKG with sinus tachycardia, rate 116 with early repolarization, isolated TWI in lead III, otherwise no STE/D. Repeat EKG this AM with continued sinus tachycardia, rate 122, early repolarization, continued T wave abnormality in lead III, otherwise no STE/D. Patient was started on IVFs and an insulin gtt for management of DKA and new onset DM type 2. He was started on IV clindamycin for management of a scrotal abscess. Cardiology asked to evaluate for chest pain.   Past Medical History:  Diagnosis Date   PNA (pneumonia)     History reviewed. No pertinent surgical history.   Home Medications:  Prior to Admission medications   Medication Sig Start Date End Date Taking? Authorizing Provider  aspirin 325 MG tablet Take 325 mg by mouth every 6 (six) hours as needed for moderate pain.   Yes [provider]    Inpatient Medications: Scheduled Meds:  aspirin EC  81 mg Oral Daily   Chlorhexidine Gluconate Cloth  6 each Topical Daily   enoxaparin (LOVENOX) injection  40 mg Subcutaneous Daily   mouth rinse  15 mL Mouth Rinse BID   polyethylene glycol  17 g Oral Daily  Continuous Infusions:  sodium chloride     clindamycin (CLEOCIN) IV Stopped (04/30/19 0651)   dextrose 5 % and 0.45% NaCl 125 mL/hr at 04/30/19 0500   insulin 11.7 Units/hr (04/30/19 0627)   PRN Meds: hydrALAZINE, hydrOXYzine, morphine injection, nitroGLYCERIN, senna-docusate  Allergies:   No Known Allergies  Social History:   Social History   Socioeconomic History    Marital status: Legally Separated    Spouse name: Not on file   Number of children: Not on file   Years of education: Not on file   Highest education level: Not on file  Occupational History   Not on file  Social Needs   Financial resource strain: Not on file   Food insecurity    Worry: Not on file    Inability: Not on file   Transportation needs    Medical: Not on file    Non-medical: Not on file  Tobacco Use   Smoking status: Former Smoker    Types: Cigars   Smokeless tobacco: Never Used  Substance and Sexual Activity   Alcohol use: Yes    Comment: occ   Drug use: Never   Sexual activity: Not on file  Lifestyle   Physical activity    Days per week: Not on file    Minutes per session: Not on file   Stress: Not on file  Relationships   Social connections    Talks on phone: Not on file    Gets together: Not on file    Attends religious service: Not on file    Active member of club or organization: Not on file    Attends meetings of clubs or organizations: Not on file    Relationship status: Not on file   Intimate partner violence    Fear of current or ex partner: Not on file    Emotionally abused: Not on file    Physically abused: Not on file    Forced sexual activity: Not on file  Other Topics Concern   Not on file  Social History Narrative   Not on file    Family History:    Family History  Problem Relation Age of Onset   Diabetes Mellitus II Mother    Diabetes Mellitus II Sister      ROS:  Please see the history of present illness.   All other ROS reviewed and negative.     Physical Exam/Data:   Vitals:   04/30/19 0400 04/30/19 0535 04/30/19 0600 04/30/19 0700  BP: (!) 127/59 (!) 158/84 (!) 169/80 (!) 144/73  Pulse: (!) 126 (!) 126 (!) 128 (!) 129  Resp: (!) 25 17 17 19   Temp: 99.3 F (37.4 C)     TempSrc: Oral     SpO2: 95% 99% 97% 96%  Weight:      Height:        Intake/Output Summary (Last 24 hours) at 04/30/2019  0756 Last data filed at 04/30/2019 0500 Gross per 24 hour  Intake 3977.25 ml  Output --  Net 3977.25 ml   Filed Weights   04/29/19 1642  Weight: (!) 167.4 kg   Body mass index is 50.05 kg/m.  General:  Well nourished, well developed, appearing mildly uncomfortable with frequent position changes HEENT: sclera anicteric  Neck: no JVD Vascular: No carotid bruits; distal pulses 2+ bilaterally Cardiac:  normal S1, S2; RRR; no murmurs, rubs, or gallops Lungs:  clear to auscultation bilaterally, no wheezing, rhonchi or rales  Abd: NABS, soft, obese, diffuse TTP  Ext: no edema Musculoskeletal:  No deformities, BUE and BLE strength normal and equal Skin: warm and dry  Neuro:  CNs 2-12 intact, no focal abnormalities noted Psych:  Normal affect   EKG:  The EKG was personally reviewed and demonstrates:  Initial EKG with sinus tachycardia, rate 116 with early repolarization, isolated TWI in lead III, otherwise no STE/D. Repeat EKG this AM with continued sinus tachycardia, rate 122, early repolarization, continued T wave abnormality in lead III, otherwise no STE/D Telemetry:  Telemetry was personally reviewed and demonstrates:  Sinus tachycardia with rate up to 130s  Relevant CV Studies: None  Laboratory Data:  Chemistry Recent Labs  Lab 04/29/19 1832 04/29/19 1842 04/30/19 0427  NA 132* 136 136  K 4.4 4.1 3.9  CL 101  --  108  CO2 12*  --  12*  GLUCOSE 314*  --  229*  BUN 8  --  6  CREATININE 1.02  --  0.95  CALCIUM 9.4  --  8.8*  GFRNONAA >60  --  >60  GFRAA >60  --  >60  ANIONGAP 19*  --  16*    Recent Labs  Lab 04/29/19 1832  PROT 9.4*  ALBUMIN 4.5  AST 71*  ALT 59*  ALKPHOS 68  BILITOT 1.2   Hematology Recent Labs  Lab 04/29/19 1832 04/29/19 1842  WBC 6.9  --   RBC 5.60  --   HGB 15.0 16.7  HCT 48.0 49.0  MCV 85.7  --   MCH 26.8  --   MCHC 31.3  --   RDW 14.5  --   PLT 200  --    Cardiac EnzymesNo results for input(s): TROPONINI in the last 168 hours.  No results for input(s): TROPIPOC in the last 168 hours.  BNPNo results for input(s): BNP, PROBNP in the last 168 hours.  DDimer No results for input(s): DDIMER in the last 168 hours.  Radiology/Studies:  [place] Abdomen Limited  Result Date: 04/29/2019 CLINICAL DATA:  28 year old male with RIGHT UPPER quadrant abdominal pain for 4 days. EXAM: ULTRASOUND ABDOMEN LIMITED RIGHT UPPER QUADRANT COMPARISON:  None. FINDINGS: Gallbladder: Gallbladder is unremarkable. There is no evidence of cholelithiasis or acute cholecystitis. Common bile duct: Diameter: 4 mm.  No intrahepatic or extrahepatic biliary dilatation. Liver: Diffuse liver is compatible increased echogenicity with hepatic steatosis of the. No definite focal hepatic abnormalities are noted. Portal vein is patent on color Doppler imaging with normal direction of blood flow towards the liver. Other: None. IMPRESSION: 1. No acute abnormality.  Normal gallbladder. 2. Hepatic steatosis Electronically Signed   By: Harmon PierJeffrey  Hu M.D.   On: 04/29/2019 21:18   [place] Scrotum W/doppler  Result Date: 04/29/2019 CLINICAL DATA:  Actively draining left scrotum. EXAM: SCROTAL ULTRASOUND DOPPLER ULTRASOUND OF THE TESTICLES TECHNIQUE: Complete ultrasound examination of the testicles, epididymis, and other scrotal structures was performed. Color and spectral Doppler ultrasound were also utilized to evaluate blood flow to the testicles. COMPARISON:  None. FINDINGS: Right testicle Measurements: 4.2 x 2.5 x 1.9 cm. No mass or microlithiasis visualized. Left testicle Measurements: 4.7 x 2.8 x 2.0 cm. No mass or microlithiasis visualized. Right epididymis:  Normal in size and appearance. Left epididymis:  Normal in size and appearance. Hydrocele:  None visualized. Varicocele:  None visualized. Pulsed Doppler interrogation of both testes demonstrates normal low resistance arterial and venous waveforms bilaterally. Ill-defined hypoechoic area is seen involving the left scrotal wall  concerning for small abscess. IMPRESSION: No evidence of testicular mass or torsion.  Ill-defined hypoechoic area is seen in left scrotal wall concerning for small abscess. Electronically Signed   By: Lupita Raider M.D.   On: 04/29/2019 21:28    Assessment and Plan:   1. Chest pain: patient presented with abdominal pain, N/V, and constipation with subsequent chest pain. Pain is burning in nature and relieved with belching and vomiting. Found to be in DKA. Initial EKG with sinus tachycardia, rate 116 with early repolarization, isolated TWI in lead III, otherwise no STE/D. Repeat EKG this AM with sinus tachycardia with increase in rate to 133, with non-specific ST-T wave abnormalities possibly due to lead placement. HsTrop 3 this morning. CP is atypical and likely related to GI etiology with nausea/vomiting. Risk factors for CAD going forward include DM type 2, HLD, +/- HTN, and family history of early CAD. - Continue to trend trop - If troponin increases, could consider checking echocardiogram to evaluate LV function and wall motion.  - If trop trend is flat - no further ischemic evaluation at this time. - Continue aggressive CV risk factor modifications below  2. DKA with new onset DM type 2: glucose elevated to 300s on presentation. Anion gap 19>16. Started on insulin gtt with improvement in glucose to 200s. HgbA1C 11.5.  - Continue management per primary team  3. HLD: cholesterol panel this morning shows Tcholesterol 221, HDL 38, LDL 175, triglycerides 175 - Would benefit from lipid lowering agent, however AST/ALT mildly elevated this admission - Favor starting atorvastatin  daily if AST/ALT normalize with plans to repeat FLP/LFTs in 2 months if started.   4. Elevated blood pressure without diagnosis of HTN: BP persistently elevated this admission. Suspect underlying HTN, though pain could be contributing as well. - Consider starting amlodipine if BP remains elevated.   5. Morbid obesity:  BMI 50. Likely the crux of his medical issues.  - Continue to encourage healthy dietary/lifestyle modifications to promote weight loss.   6. Abdominal pain: ongoing for 4 days with associated nausea/vomiting. LFTs mildly elevated. Hepatitis panel pending. CT A/P ordered - Continue management per primary team   For questions or updates, please contact [facility] Please consult [email or web address] for contact info under Cardiology/STEMI.   Signed, Beatriz Stallion, PA-C  04/30/2019 7:56 AM [phone]  I have seen and examined the patient along with Beatriz Stallion, PA-C.   I have reviewed the chart, notes and new data.  I agree with PA/NP's note.  Key new complaints: good functional status before the acute illness, now with chest discomfort suggestive of GI etiology, not angina Key examination changes: exam limited by morbid obesity, but normal CV exam other than tachycardia. Key new findings / data: no ischemic ECG changes, low troponin levels, mixed hyperlipidemia. Agree with statin therapy (target LDL<100) after discharge, with f/u lipid profile and LFTs in 2-3 months  PLAN: No plan for additional inpatient CV evaluation. Will sign off.  Thurmon Fair, MD, [facility] [place] [facility] [phone] 04/30/2019, 10:38 AM

## 2019-04-30 NOTE — ED Notes (Signed)
Spoke with provider about 5/10 chest pain in upper right chest, reproducible upon palpation. Provider aware, no new orders at this time.

## 2019-04-30 NOTE — Progress Notes (Signed)
Patient arrived to [facility] to room 1236 at 0300. Transport personnel reported that patients CBG at 0210 was 257 and insulin drip was changed to 11.12mL/hr. Reported that patient was ST while in transport with latest reading 134, BP 147/92, Sat 96% on room air. Patient arrived alert and oriented x4. He is able to participate in admission assessment and history. He denies pain and verbalizes mild nausea. Tele monitor applied and tele personnel notified for verification. CBG bath given. Skin assessment completed and verified with charge nurse, Wilburn Cornelia. Sacral foam applied. MRSA nares specimen completed. CBG at 0315 was 234 and insulin drip adjusted per protocol to 12.64mL/hr. Admission physician notified via page by charge nurse.

## 2019-04-30 NOTE — Progress Notes (Signed)
Inpatient Diabetes Program Recommendations  AACE/ADA: New Consensus Statement on Inpatient Glycemic Control (2015)  Target Ranges:  Prepandial:   less than 140 mg/dL      Peak postprandial:   less than 180 mg/dL (1-2 hours)      Critically ill patients:  140 - 180 mg/dL   Lab Results  Component Value Date   GLUCAP 150 (H) 04/30/2019   HGBA1C 11.5 (H) 04/30/2019    Review of Glycemic Control  Diabetes history: New-onset DM Outpatient Diabetes medications: None Current orders for Inpatient glycemic control: IV insulin  HgbA1C - 11.5%  Inpatient Diabetes Program Recommendations:     Continue IV insulin until criteria met for discontinuation. AG 14, CO2 - 12.  Lantus 30 units Q24H (give 2 hours prior to discontinuation of drip) Novolog 0-15 units Q4H x 12H, then tidwc and 0-5 units QHS Novolog 6 units tidwc for meal coverage insulin if pt eats > 50% meal.  Spoke with patient about new diabetes diagnosis.  Discussed A1C results (11.5%) and explained what an A1C is and informed patient that his current A1C indicates an average glucose of 240 mg/dl over the past 2-3 months. Discussed basic pathophysiology of DM Type 2, basic home care, importance of checking CBGs and maintaining good CBG control to prevent long-term and short-term complications. Reviewed glucose and A1C goals.  Reviewed signs and symptoms of hyperglycemia and hypoglycemia along with treatment for both. Discussed impact of nutrition, exercise, stress, sickness, and medications on diabetes control. Reviewed Living Well with diabetes booklet and encouraged patient to read through entire book.   Pt states he drinks sweet tea, sodas, Gatorade, juices, and will have to find good substitutes. Discussed lifestyle modifications with exercise and portion control. Pt states he lives with his Mom and she uses insulin pen. Pt states he would prefer insulin pen over vial and drawing up with syringe. Pt has no insurance coverage therefore  will need affordable insulins at discharge. Has PCP in [place] and has scheduled appt for 10/20. Will need to monitor blood sugars 3-4x/day and take logbook to PCP appt. Answered questions. Will f/u in am.   Thank you. Lorenda Peck, RD, LDN, CDE Inpatient Diabetes Coordinator [phone]

## 2019-04-30 NOTE — H&P (Addendum)
History and Physical    George Sanders. VEL:[id number] DOB: 1991/06/16 DOA: 04/29/2019  Referring MD/NP/PA:   PCP: System, Provider Not In   Patient coming from:  The patient is coming from home.  At baseline, pt is independent for most of ADL.        Chief Complaint: nausea, vomiting, upper abdominal pain, generalized weakness, constipation, chest pain, scrotum abscess  HPI: George Sanders. is a 28 y.o. male  without significant past medical history except for obesity, who presents with nausea, vomiting, upper abdominal pain, generalized weakness, constipation, headache.   Pt sates that he has been having nausea, vomiting, abdominal pain for more than 4 days.  Abdominal pain is located in the middle abdomen, constant, 8 out of 10 in severity, sharp, nonradiating.  He has been having nonbilious and nonbloody vomiting.  He vomited once today.  Patient is constipated.  Last bowel movement was 4 days ago.  Patient also complains of chest pain, which is located in the central chest, 7 out of 10 severity, pressure-like, radiating to the throat. No tenderness in the calf areas.  No recent long distance traveling.  Chest pain is not pleuritic. He does not have shortness of breath.  He has mild cough, but no fever or chills.  He has polyuria and polydipsia. No dysuria.  Patient has scrotum abscess which is actively draining in the past 2 days. He reports headache and feeling weak x 4-5 days. Per EDP, his mom checked his BG at home and it was high in the 300s so she gave him unknown amount of unknown type of insulin.   ED Course: pt was found to have DKA with blood sugar 314, bicarbonate 12, anion gap of 19, abnormal liver function (ALP 68, AST 71, ALT 59, total bilirubin 1.2), negative Covid19,  lactic acid 1.6, WBC 6.7, creatinine 1.02, BUN 8, temperature normal, blood pressure 118/74, heart rate 126, RR 24, oxygen saturation 99% on room air. VBG showed pH of 7.225, CO2 31.5, O2 39. Pt is admitted to  SDU as inpt.  # US-abdomen: 1. No acute abnormality.  Normal gallbladder. 2. Hepatic steatosis  # US-Scrotum: 1. No evidence of testicular mass or torsion. 2. Ill-defined hypoechoic area is seen in left scrotal wall concerning for small abscess.  Review of Systems:   General: no fevers, chills, no body weight gain, has poor appetite, has fatigue HEENT: no blurry vision, hearing changes or sore throat Respiratory: no dyspnea, coughing, wheezing CV: has chest pain, no palpitations GI: has nausea, vomiting, abdominal pain, constipation GU: no dysuria, burning on urination, has increased urinary frequency, no hematuria  Ext: no leg edema Neuro: no unilateral weakness, numbness, or tingling, no vision change or hearing loss Skin: no rash, no skin tear. Has scrotum abscess MSK: No muscle spasm, no deformity, no limitation of range of movement in spin Heme: No easy bruising.  Travel history: No recent long distant travel.  Allergy: No Known Allergies  Past Medical History:  Diagnosis Date   PNA (pneumonia)     History reviewed. No pertinent surgical history.  Social History:  reports that he has quit smoking. His smoking use included cigars. He has never used smokeless tobacco. He reports current alcohol use. He reports that he does not use drugs.  Family History:  Family History  Problem Relation Age of Onset   Diabetes Mellitus II Mother    Diabetes Mellitus II Sister      Prior to Admission medications   Medication  Sig Start Date End Date Taking? Authorizing Provider  diphenhydrAMINE (BENADRYL) 25 MG tablet Take 2 tablets (50 mg total) by mouth every 6 (six) hours as needed for itching. 10/21/18   Molpus, [name], MD    Physical Exam: Vitals:   04/29/19 2230 04/30/19 0100 04/30/19 0321 04/30/19 0400  BP: 131/89 (!) 142/78 (!) 151/86   Pulse: (!) 125 (!) 124 (!) 129   Resp: (!) 21 (!) 9 15   Temp:  98.3 F (36.8 C)  99.3 F (37.4 C)  TempSrc:  Oral  Oral  SpO2:  96% 95% 100%   Weight:      Height:       General: Not in acute distress.  Dry mucus membrane HEENT:       Eyes: PERRL, EOMI, no scleral icterus.       ENT: No discharge from the ears and nose, no pharynx injection, no tonsillar enlargement.        Neck: No JVD, no bruit, no mass felt. Heme: No neck lymph node enlargement. Cardiac: S1/S2, RRR, No murmurs, No gallops or rubs. Respiratory:  No rales, wheezing, rhonchi or rubs. GI: Soft, nondistended, has tenderness diffusely, no rebound pain, no organomegaly, BS present. GU: No hematuria Ext: No pitting leg edema bilaterally. 2+DP/PT pulse bilaterally. Musculoskeletal: No joint deformities, No joint redness or warmth, no limitation of ROM in spin. Skin: No rashes. Has scrotum abscess in the left side with draining Neuro: Alert, oriented X3, cranial nerves II-XII grossly intact, moves all extremities normally Psych: Patient is not psychotic, no suicidal or hemocidal ideation.  Labs on Admission: I have personally reviewed following labs and imaging studies  CBC: Recent Labs  Lab 04/29/19 1832 04/29/19 1842  WBC 6.9  --   NEUTROABS 4.3  --   HGB 15.0 16.7  HCT 48.0 49.0  MCV 85.7  --   PLT 200  --    Basic Metabolic Panel: Recent Labs  Lab 04/29/19 1832 04/29/19 1842  NA 132* 136  K 4.4 4.1  CL 101  --   CO2 12*  --   GLUCOSE 314*  --   BUN 8  --   CREATININE 1.02  --   CALCIUM 9.4  --    GFR: Estimated Creatinine Clearance: 177.7 mL/min (by C-G formula based on SCr of 1.02 mg/dL). Liver Function Tests: Recent Labs  Lab 04/29/19 1832  AST 71*  ALT 59*  ALKPHOS 68  BILITOT 1.2  PROT 9.4*  ALBUMIN 4.5   Recent Labs  Lab 04/29/19 1832  LIPASE 22   No results for input(s): AMMONIA in the last 168 hours. Coagulation Profile: No results for input(s): INR, PROTIME in the last 168 hours. Cardiac Enzymes: No results for input(s): CKTOTAL, CKMB, CKMBINDEX, TROPONINI in the last 168 hours. BNP (last 3  results) No results for input(s): PROBNP in the last 8760 hours. HbA1C: No results for input(s): HGBA1C in the last 72 hours. CBG: Recent Labs  Lab 04/29/19 2054 04/29/19 2219 04/29/19 2340 04/30/19 0100 04/30/19 0210  GLUCAP 252* 244* 264* 206* 257*   Lipid Profile: No results for input(s): CHOL, HDL, LDLCALC, TRIG, CHOLHDL, LDLDIRECT in the last 72 hours. Thyroid Function Tests: No results for input(s): TSH, T4TOTAL, FREET4, T3FREE, THYROIDAB in the last 72 hours. Anemia Panel: No results for input(s): VITAMINB12, FOLATE, FERRITIN, TIBC, IRON, RETICCTPCT in the last 72 hours. Urine analysis: No results found for: COLORURINE, APPEARANCEUR, LABSPEC, PHURINE, GLUCOSEU, HGBUR, BILIRUBINUR, KETONESUR, PROTEINUR, UROBILINOGEN, NITRITE, LEUKOCYTESUR Sepsis Labs: (procalcitonin:4,lacticidven:4) )  Recent Results (from the past 240 hour(s))  SARS Coronavirus 2 [facility] order, Performed in [facility] hospital lab) Nasopharyngeal Nasopharyngeal Swab     Status: None   Collection Time: 04/29/19  8:15 PM   Specimen: Nasopharyngeal Swab  Result Value Ref Range Status   SARS Coronavirus 2 NEGATIVE NEGATIVE Final    Comment: (NOTE) If result is NEGATIVE SARS-CoV-2 target nucleic acids are NOT DETECTED. The SARS-CoV-2 RNA is generally detectable in upper and lower  respiratory specimens during the acute phase of infection. The lowest  concentration of SARS-CoV-2 viral copies this assay can detect is 250  copies / mL. A negative result does not preclude SARS-CoV-2 infection  and should not be used as the sole basis for treatment or other  patient management decisions.  A negative result may occur with  improper specimen collection / handling, submission of specimen other  than nasopharyngeal swab, presence of viral mutation(s) within the  areas targeted by this assay, and inadequate number of viral copies  (<250 copies / mL). A negative result must be combined with clinical   observations, patient history, and epidemiological information. If result is POSITIVE SARS-CoV-2 target nucleic acids are DETECTED. The SARS-CoV-2 RNA is generally detectable in upper and lower  respiratory specimens dur ing the acute phase of infection.  Positive  results are indicative of active infection with SARS-CoV-2.  Clinical  correlation with patient history and other diagnostic information is  necessary to determine patient infection status.  Positive results do  not rule out bacterial infection or co-infection with other viruses. If result is PRESUMPTIVE POSTIVE SARS-CoV-2 nucleic acids MAY BE PRESENT.   A presumptive positive result was obtained on the submitted specimen  and confirmed on repeat testing.  While 2019 novel coronavirus  (SARS-CoV-2) nucleic acids may be present in the submitted sample  additional confirmatory testing may be necessary for epidemiological  and / or clinical management purposes  to differentiate between  SARS-CoV-2 and other Sarbecovirus currently known to infect humans.  If clinically indicated additional testing with an alternate test  methodology [phone]) is advised. The SARS-CoV-2 RNA is generally  detectable in upper and lower respiratory sp ecimens during the acute  phase of infection. The expected result is Negative. Fact Sheet for Patients:  BoilerBrush.com.cy Fact Sheet for Healthcare Providers: https://pope.com/ This test is not yet approved or cleared by the [place] FDA and has been authorized for detection and/or diagnosis of SARS-CoV-2 by FDA under an Emergency Use Authorization (EUA).  This EUA will remain in effect (meaning this test can be used) for the duration of the COVID-19 declaration under Section 564(b)(1) of the Act, 21 U.S.C. section 360bbb-3(b)(1), unless the authorization is terminated or revoked sooner. Performed at [facility], [address] [place], [place] [address]      Radiological Exams on Admission: US Abdomen Limited  Result Date: 04/29/2019 CLINICAL DATA:  28 year old male with RIGHT UPPER quadrant abdominal pain for 4 days. EXAM: ULTRASOUND ABDOMEN LIMITED RIGHT UPPER QUADRANT COMPARISON:  None. FINDINGS: Gallbladder: Gallbladder is unremarkable. There is no evidence of cholelithiasis or acute cholecystitis. Common bile duct: Diameter: 4 mm.  No intrahepatic or extrahepatic biliary dilatation. Liver: Diffuse liver is compatible increased echogenicity with hepatic steatosis of the. No definite focal hepatic abnormalities are noted. Portal vein is patent on color Doppler imaging with normal direction of blood flow towards the liver. Other: None. IMPRESSION: 1. No acute abnormality.  Normal gallbladder. 2. Hepatic steatosis Electronically Signed  By: Harmon PierJeffrey  Hu M.D.   On: 04/29/2019 21:18   [place] Scrotum W/doppler  Result Date: 04/29/2019 CLINICAL DATA:  Actively draining left scrotum. EXAM: SCROTAL ULTRASOUND DOPPLER ULTRASOUND OF THE TESTICLES TECHNIQUE: Complete ultrasound examination of the testicles, epididymis, and other scrotal structures was performed. Color and spectral Doppler ultrasound were also utilized to evaluate blood flow to the testicles. COMPARISON:  None. FINDINGS: Right testicle Measurements: 4.2 x 2.5 x 1.9 cm. No mass or microlithiasis visualized. Left testicle Measurements: 4.7 x 2.8 x 2.0 cm. No mass or microlithiasis visualized. Right epididymis:  Normal in size and appearance. Left epididymis:  Normal in size and appearance. Hydrocele:  None visualized. Varicocele:  None visualized. Pulsed Doppler interrogation of both testes demonstrates normal low resistance arterial and venous waveforms bilaterally. Ill-defined hypoechoic area is seen involving the left scrotal wall concerning for small abscess. IMPRESSION: No evidence of testicular mass or torsion. Ill-defined hypoechoic area is seen in left scrotal wall  concerning for small abscess. Electronically Signed   By: Lupita RaiderJames  Green Jr M.D.   On: 04/29/2019 21:28     EKG: Independently reviewed.  Sinus rhythm, tachycardia, QTC 462, nonspecific T wave change. Assessment/Plan Principal Problem:   DKA (diabetic ketoacidoses) (HCC) Active Problems:   New onset type 2 diabetes mellitus (HCC)   Abscess of scrotum   Abnormal LFTs   Abdominal pain   Chest pain   Elevated blood pressure reading   DKA (diabetic ketoacidoses) and new onset type 2 diabetes mellitus: blood sugar 314, bicarbonate 12, anion gap of 19  - Admit to stepdown as inpt - 4L of NS bolus - start DKA protocol with BMP q4h - IVF: NS 125 cc/h; will switch to D5-1/2NS when CBG<250 - replete K as needed - Zofran prn nausea -->changed to prn hydroxyzine due to QT prolongation - NPO  - consult to diabetic educator and case manager  Abscess of scrotum: -IV clindamycin -f/u tissue culture and blood culture  Abdominal pain: Etiology is not clear.  Lipase 22.  Abdominal ultrasound is negative.  He has diffused abdominal pain, but abdomen is soft.  Possibly due to DKA and constipation. -As needed morphine for pain. - MiraLAX and Senokot for constipation  Abnormal LFTs:  -Check HIV antibody, hepatitis panel -Avoid using Tylenol   Chest pain:  - Trend Trop - Repeat EKG in the am  - prn Nitroglycerin, Morphine, and aspirin - Risk factor stratification: will check FLP and A1C  - check UDS - CXR - inpt card consult was requested via Epic  Elevated blood pressure reading: Bp 151/86 when I saw pt. No hx of HTN.  -prn hydralazine orally. If has persistent elevation of Bp, may need to start oral meds.    Inpatient status:  # Patient requires inpatient status due to high intensity of service, high risk for further deterioration and high frequency of surveillance required.  I certify that at the point of admission it is my clinical judgment that the patient will require inpatient  hospital care spanning beyond 2 midnights from the point of admission.   Patient has presenting with new onset diabetes mellitus with DKA, abdominal pain, chest pain, elevated blood pressure, abnormal liver function, scrotum abscess  The worrisome physical exam findings include diffused abdominal tenderness, scrotum abscess with active draining  The initial radiographic and laboratory data are worrisome because of DKA, abnormal liver function  Current medical needs: please see my assessment and plan  Predictability of an adverse outcome (risk): though patient does  not have significant past medical history except for obesity, he has complicated presentation, including new onset diabetes mellitus with DKA, abdominal pain, chest pain, elevated blood pressure, abnormal liver function, scrotum abscess. He is at high risk for deteriorating.  Will need to be treated in hospital for at least 2 days.         DVT ppx:    SQ Lovenox Code Status: Full code Family Communication: None at bed side.     Disposition Plan:  Anticipate discharge back to previous home environment Consults called:  none Admission status:   SDU/inpation       Date of Service 04/30/2019    Lorretta Harp [facility]   If 7PM-7AM, please contact night-coverage [email or web address] Password [email or web address] 04/30/2019, 4:20 AM

## 2019-04-30 NOTE — Progress Notes (Signed)
[facility] PLAN OF CARE NOTE Patient: Curren Mohrmann. YWV:[id number]   PCP: System, Provider Not In DOB: [birth year]   DOA: 04/29/2019   DOS: 04/30/2019    Patient was admitted by my colleague Dr. Blaine Hamper earlier on 04/30/2019. I have reviewed the H&P as well as assessment and plan and agree with the same. Important changes in the plan are listed below.  Plan of care: Principal Problem:   DKA (diabetic ketoacidoses) ([place]) Active Problems:   New onset type 2 diabetes mellitus ([place])   Abscess of scrotum   Abnormal LFTs   Abdominal pain   Chest pain   Elevated blood pressure reading   DKA. Anion gap closed. Bicarb 15. Suspect patient can be transitioned to basal bolus regimen. 20 units of Lantus, every 4 hours sliding scale, 4 units of pre-meal coverage. Continue NS, discontinued D5 after stopping insulin drip.  Scrotal ulcer. CT pelvis shows no evidence of hernia or's. Continue IV clindamycin for now.  Abdominal pain nausea. Moderate stool burden throughout the colon. Continue stool softeners and bowel regimen.  Author: Berle Mull, MD [facility] 04/30/2019 5:37 PM   If 7PM-7AM, please contact night-coverage at [email or web address]

## 2019-04-30 NOTE — ED Notes (Signed)
[facility] notified (Taryn) - patient ready for transport 

## 2019-04-30 NOTE — Progress Notes (Signed)
EKG obtained, showing abnormal St, Acute MI, NSTEMI. Charge nurse notified and she paged Dr. Blaine Hamper. MD returned page. Awaiting lab results.

## 2019-05-01 LAB — BASIC METABOLIC PANEL
Anion gap: 12 (ref 5–15)
BUN: <5 mg/dL — ABNORMAL LOW (ref 6–20)
CO2: 15 mmol/L — ABNORMAL LOW (ref 22–32)
Calcium: 8.7 mg/dL — ABNORMAL LOW (ref 8.9–10.3)
Chloride: 106 mmol/L (ref 98–111)
Creatinine, Ser: 0.84 mg/dL (ref 0.61–1.24)
GFR calc Af Amer: >60 mL/min (ref >60)
GFR calc non Af Amer: >60 mL/min (ref >60)
Glucose, Bld: 211 mg/dL — ABNORMAL HIGH (ref 70–99)
Potassium: 3.4 mmol/L — ABNORMAL LOW (ref 3.5–5.1)
Sodium: 133 mmol/L — ABNORMAL LOW (ref 135–145)

## 2019-05-01 LAB — CBC WITH DIFFERENTIAL/PLATELET
Abs Immature Granulocytes: 0.02 10*3/uL (ref 0.00–0.07)
Basophils Absolute: 0 10*3/uL (ref 0.0–0.1)
Basophils Relative: 0 %
Eosinophils Absolute: 0.1 10*3/uL (ref 0.0–0.5)
Eosinophils Relative: 1 %
HCT: 44 % (ref 39.0–52.0)
Hemoglobin: 13.6 g/dL (ref 13.0–17.0)
Immature Granulocytes: 0 %
Lymphocytes Relative: 21 %
Lymphs Abs: 1.7 10*3/uL (ref 0.7–4.0)
MCH: 26.7 pg (ref 26.0–34.0)
MCHC: 30.9 g/dL (ref 30.0–36.0)
MCV: 86.4 fL (ref 80.0–100.0)
Monocytes Absolute: 0.9 10*3/uL (ref 0.1–1.0)
Monocytes Relative: 11 %
Neutro Abs: 5.4 10*3/uL (ref 1.7–7.7)
Neutrophils Relative %: 67 %
Platelets: 178 10*3/uL (ref 150–400)
RBC: 5.09 MIL/uL (ref 4.22–5.81)
RDW: 14.6 % (ref 11.5–15.5)
WBC: 8.1 10*3/uL (ref 4.0–10.5)
nRBC: 0 % (ref 0.0–0.2)

## 2019-05-01 LAB — GLUCOSE, CAPILLARY
Glucose-Capillary: 156 mg/dL — ABNORMAL HIGH (ref 70–99)
Glucose-Capillary: 167 mg/dL — ABNORMAL HIGH (ref 70–99)
Glucose-Capillary: 180 mg/dL — ABNORMAL HIGH (ref 70–99)
Glucose-Capillary: 207 mg/dL — ABNORMAL HIGH (ref 70–99)
Glucose-Capillary: 210 mg/dL — ABNORMAL HIGH (ref 70–99)
Glucose-Capillary: 215 mg/dL — ABNORMAL HIGH (ref 70–99)
Glucose-Capillary: 217 mg/dL — ABNORMAL HIGH (ref 70–99)
Glucose-Capillary: 225 mg/dL — ABNORMAL HIGH (ref 70–99)
Glucose-Capillary: 261 mg/dL — ABNORMAL HIGH (ref 70–99)

## 2019-05-01 LAB — MAGNESIUM: Magnesium: 1.8 mg/dL (ref 1.7–2.4)

## 2019-05-01 MED ORDER — METOPROLOL TARTRATE 25 MG PO TABS: 50.0000 mg | ORAL_TABLET | Freq: Three times a day (TID) | ORAL | Status: DC

## 2019-05-01 MED ORDER — INSULIN ASPART PROT & ASPART (70-30 MIX) 100 UNIT/ML ~~LOC~~ SUSP
20.0000 [IU] | Freq: Two times a day (BID) | SUBCUTANEOUS | Status: DC
  Administered 2019-05-01: 18:00:00 20 [IU] via SUBCUTANEOUS
  Filled 2019-05-01: qty 10

## 2019-05-01 MED ORDER — HYDRALAZINE HCL 20 MG/ML IJ SOLN
10.0000 mg | Freq: Once | INTRAMUSCULAR | Status: AC
  Administered 2019-05-01: 10 mg via INTRAVENOUS
  Filled 2019-05-01: qty 1

## 2019-05-01 MED ORDER — ENOXAPARIN SODIUM 80 MG/0.8ML ~~LOC~~ SOLN
80.0000 mg | Freq: Every day | SUBCUTANEOUS | Status: DC
  Administered 2019-05-02: 80 mg via SUBCUTANEOUS
  Filled 2019-05-01: qty 0.8

## 2019-05-01 MED ORDER — ACETAMINOPHEN 325 MG PO TABS: 650.0000 mg | ORAL_TABLET | Freq: Four times a day (QID) | ORAL | Status: DC | PRN

## 2019-05-01 MED ORDER — INSULIN ASPART 100 UNIT/ML ~~LOC~~ SOLN
0.0000 [IU] | Freq: Three times a day (TID) | SUBCUTANEOUS | Status: DC
  Administered 2019-05-01 – 2019-05-02 (×4): 5 [IU] via SUBCUTANEOUS
  Administered 2019-05-02 (×2): 3 [IU] via SUBCUTANEOUS

## 2019-05-01 MED ORDER — BUTALBITAL-APAP-CAFFEINE 50-325-40 MG PO TABS: 1.0000 | ORAL_TABLET | Freq: Four times a day (QID) | ORAL | Status: DC | PRN

## 2019-05-01 MED ORDER — INSULIN ASPART 100 UNIT/ML ~~LOC~~ SOLN
0.0000 [IU] | Freq: Every day | SUBCUTANEOUS | Status: DC
  Administered 2019-05-01: 3 [IU] via SUBCUTANEOUS

## 2019-05-01 MED ORDER — IBUPROFEN 800 MG PO TABS
800.0000 mg | ORAL_TABLET | Freq: Once | ORAL | Status: AC
  Administered 2019-05-01: 05:00:00 800 mg via ORAL
  Filled 2019-05-01: qty 1

## 2019-05-01 MED ORDER — METOPROLOL TARTRATE 50 MG PO TABS
50.0000 mg | ORAL_TABLET | Freq: Three times a day (TID) | ORAL | Status: DC
  Administered 2019-05-01 – 2019-05-02 (×4): 50 mg via ORAL
  Filled 2019-05-01: qty 1
  Filled 2019-05-01: qty 2
  Filled 2019-05-01 (×2): qty 1

## 2019-05-01 MED ORDER — HYDROCODONE-ACETAMINOPHEN 5-325 MG PO TABS
2.0000 | ORAL_TABLET | Freq: Once | ORAL | Status: AC
  Administered 2019-05-01: 05:00:00 2 via ORAL
  Filled 2019-05-01: qty 2

## 2019-05-01 MED ORDER — METOPROLOL TARTRATE 25 MG PO TABS
25.0000 mg | ORAL_TABLET | Freq: Two times a day (BID) | ORAL | Status: DC
  Administered 2019-05-01: 12:00:00 25 mg via ORAL
  Filled 2019-05-01: qty 1

## 2019-05-01 NOTE — Progress Notes (Signed)
Patient's b/p still high, Talked to Dr Posey Pronto. Will give additional dose of lopressor. MD said okay to still transfer.

## 2019-05-01 NOTE — Progress Notes (Signed)
[place], STABLE. ON TELE. GIVEN NOW DOSE OF LOPRESSOR FOR ELEVATED B/P. REPORT GIVEN TO MELISSA RN.

## 2019-05-01 NOTE — Progress Notes (Addendum)
BP 191/99 map 126 w/ c/o HA 7/10. Paged K. Schorr. New one time order for 10mg  IV hydralazine and 800 mg ibuprofen PO. Will continue to monitor.

## 2019-05-01 NOTE — Progress Notes (Signed)
Patient transferred from ICU to this unit. BP now 139/91 HR 111. Patient stable and is alert and oriented x4. No pain, tele monitor placed.

## 2019-05-01 NOTE — Progress Notes (Signed)
Inpatient Diabetes Program Recommendations  AACE/ADA: New Consensus Statement on Inpatient Glycemic Control (2015)  Target Ranges:  Prepandial:   less than 140 mg/dL      Peak postprandial:   less than 180 mg/dL (1-2 hours)      Critically ill patients:  140 - 180 mg/dL   Lab Results  Component Value Date   GLUCAP 207 (H) 05/01/2019   HGBA1C 11.5 (H) 04/30/2019    Review of Glycemic Control  Educated patient on insulin pen use at home. Reviewed contents of insulin flexpen starter kit. Reviewed all steps if insulin pen including attachment of needle, 2-unit air shot, dialing up dose, giving injection, removing needle, disposal of sharps, storage of unused insulin, disposal of insulin etc. Patient able to provide successful return demonstration. Also reviewed troubleshooting with insulin pen. MD to give patient Rxs for insulin pens and insulin pen needles.  Pt does not have insurance, therefore will need affordable insulin. Prefers insulin pen.  Inpatient Diabetes Program Recommendations:     Start Novolog 70/30 20 units bid (starting today at 1700) D/C Lantus.  For discharge:  Novolin 70/30 (ReliOn brand at [facility])  Insulin pen needles # [id number] Glucose meter # [id number]  F/U with PCP within a week of discharge.   Thank you. Lorenda Peck, RD, LDN, CDE Inpatient Diabetes Coordinator [phone]

## 2019-05-01 NOTE — Progress Notes (Signed)
[facility] Progress Note  Patient: George Sanders. OQH:[id number]   PCP: System, Provider Not In DOB: Nov 08, 1990   DOA: 04/29/2019   DOS: 05/01/2019   Date of Service: the patient was seen and examined on 05/01/2019  Chief Complaint  Patient presents with  . Multiple c/o     Brief hospital course: Pt. with PMH of obesity; presented with complain of nausea vomiting and abdominal pain as well as generalized weakness and constipation, was found to have DKA and constipation as well as scrotal abscess.  Currently further plan is continue IV antibiotics and continue treatment of diabetes.  Subjective: Reports nausea but no vomiting.  No BM so far but passing gas.  Oral intake improving.  No fever no chills.  Assessment and Plan: 1.  DKA. New onset type 2 diabetes mellitus Hemoglobin A1c significantly elevated. Presented with anion gap of 19. Currently anion gap closed. Still has some acidosis on labs. Treated with IV insulin protocol. Currently transitioning to subcutaneous insulin. Basal bolus regimen. Outpatient follow-up with PCP recommended. Diabetes educator assistance appreciated.  2.  Accelerated hypertension. Blood pressure significantly elevated. Along with sinus tachycardia. Continue Lopressor.  3.  Abnormal EKG. Chest pain Patient had some chest pain on admission.  EKG also had some concern for ST elevation. Cardiology was consulted echocardiogram showed no significant abnormality. Troponins were unremarkable as well. No further cardiac work-up in the hospital.  4.  Scrotal abscess  Treated with IV clindamycin. We will continue to monitor and follow-up on cultures. CT abdomen pelvis with contrast was negative for any evidence of Fournier's gangrene. Although patient remains at risk for further worsening. Dressing changes per wound care.  5. Body mass index is 50.05 kg/m.  Continue to engage in discussion regarding diet and exercise.  Diet: Carb  modified diet  DVT Prophylaxis: Subcutaneous Lovenox  Advance goals of care discussion: Full code  Family Communication: no family was present at bedside, at the time of interview.   Disposition:  Discharge to be determined .  Consultants: none Procedures: none  Scheduled Meds: . aspirin EC  81 mg Oral Daily  . Chlorhexidine Gluconate Cloth  6 each Topical Daily  . [START ON 05/02/2019] enoxaparin (LOVENOX) injection  80 mg Subcutaneous Daily  . insulin aspart  0-15 Units Subcutaneous TID WC  . insulin aspart  0-5 Units Subcutaneous QHS  . insulin aspart protamine- aspart  20 Units Subcutaneous BID WC  . mouth rinse  15 mL Mouth Rinse BID  . metoprolol tartrate  50 mg Oral TID  . polyethylene glycol  17 g Oral BID  . senna-docusate  2 tablet Oral BID   Continuous Infusions: . clindamycin (CLEOCIN) IV Stopped (05/01/19 1435)   PRN Meds: acetaminophen, butalbital-acetaminophen-caffeine, hydrALAZINE, hydrOXYzine, morphine injection, nitroGLYCERIN, ondansetron (ZOFRAN) IV, senna-docusate Antibiotics: Anti-infectives (From admission, onward)   Start     Dose/Rate Route Frequency Ordered Stop   04/30/19 0600  clindamycin (CLEOCIN) IVPB 600 mg     600 mg 100 mL/hr over 30 Minutes Intravenous Every 8 hours 04/30/19 0324     04/29/19 1830  clindamycin (CLEOCIN) IVPB 600 mg     600 mg 100 mL/hr over 30 Minutes Intravenous  Once 04/29/19 1824 04/29/19 1936       Objective: Physical Exam: Vitals:   05/01/19 1753 05/01/19 1800 05/01/19 1829 05/01/19 1834  BP: (!) 191/102  (!) 139/91   Pulse: (!) 104 (!) 116 (!) 111 (!) 105  Resp:      Temp:  98.4 F (36.9 C)   TempSrc:   Oral   SpO2:  98% 99%   Weight:      Height:        Intake/Output Summary (Last 24 hours) at 05/01/2019 2031 Last data filed at 05/01/2019 1435 Gross per 24 hour  Intake 723.23 ml  Output 1350 ml  Net -626.77 ml   Filed Weights   04/29/19 1642  Weight: (!) 167.4 kg   General: alert and oriented to  time, place, and person. Appear in mild distress, affect appropriate Eyes: PERRL, Conjunctiva normal ENT: Oral Mucosa Clear, moist  Neck: no JVD, no Abnormal Mass Or lumps Cardiovascular: S1 and S2 Present, no Murmur, peripheral pulses symmetrical Respiratory: good respiratory effort, Bilateral Air entry equal and Decreased, no signs of accessory muscle use, Clear to Auscultation, no Crackles, no wheezes Abdomen: Bowel Sound present, Soft and no tenderness, no hernia Skin: Scrotal skin purulent discharge and abscess Extremities: no Pedal edema, no calf tenderness Neurologic: without any new focal findings Gait not checked due to patient safety concerns  Data Reviewed: I have personally reviewed and interpreted daily labs, tele strips, imagings as discussed above. I reviewed all nursing notes, pharmacy notes, vitals, pertinent old records I have discussed plan of care as described above with RN and patient/family.  CBC: Recent Labs  Lab 04/29/19 1832 04/29/19 1842 05/01/19 0154  WBC 6.9  --  8.1  NEUTROABS 4.3  --  5.4  HGB 15.0 16.7 13.6  HCT 48.0 49.0 44.0  MCV 85.7  --  86.4  PLT 200  --  119   Basic Metabolic Panel: Recent Labs  Lab 04/29/19 1832 04/29/19 1842 04/30/19 0427 04/30/19 1128 04/30/19 1524 05/01/19 0154  NA 132* 136 137  136 135 133* 133*  K 4.4 4.1 3.8  3.9 3.2* 3.1* 3.4*  CL 101  --  109  108 109 108 106  CO2 12*  --  9*  12* 12* 15* 15*  GLUCOSE 314*  --  226*  229* 156* 180* 211*  BUN 8  --  7  6 5* 5* <5*  CREATININE 1.02  --  0.93  0.95 0.82 0.74 0.84  CALCIUM 9.4  --  8.8*  8.8* 8.6* 8.5* 8.7*  MG  --   --   --   --   --  1.8    Liver Function Tests: Recent Labs  Lab 04/29/19 1832  AST 71*  ALT 59*  ALKPHOS 68  BILITOT 1.2  PROT 9.4*  ALBUMIN 4.5   Recent Labs  Lab 04/29/19 1832  LIPASE 22   No results for input(s): AMMONIA in the last 168 hours. Coagulation Profile: No results for input(s): INR, PROTIME in the last 168  hours. Cardiac Enzymes: No results for input(s): CKTOTAL, CKMB, CKMBINDEX, TROPONINI in the last 168 hours. BNP (last 3 results) No results for input(s): PROBNP in the last 8760 hours. CBG: Recent Labs  Lab 04/30/19 2346 05/01/19 0346 05/01/19 0746 05/01/19 1134 05/01/19 1706  GLUCAP 217* 207* 210* 225* 215*   Studies: No results found.   Time spent: 35 minutes  Author: Berle Mull, MD [facility] 05/01/2019 8:31 PM  To reach On-call, see care teams to locate the attending and reach out to them via [email or web address]. If 7PM-7AM, please contact night-coverage If you still have difficulty reaching the attending provider, please page the [facility] (Director on Call) for [facility] on amion for assistance.

## 2019-05-02 LAB — BASIC METABOLIC PANEL
Anion gap: 12 (ref 5–15)
Anion gap: 8 (ref 5–15)
BUN: 5 mg/dL — ABNORMAL LOW (ref 6–20)
BUN: 6 mg/dL (ref 6–20)
CO2: 18 mmol/L — ABNORMAL LOW (ref 22–32)
CO2: 21 mmol/L — ABNORMAL LOW (ref 22–32)
Calcium: 9.3 mg/dL (ref 8.9–10.3)
Calcium: 9 mg/dL (ref 8.9–10.3)
Chloride: 106 mmol/L (ref 98–111)
Chloride: 105 mmol/L (ref 98–111)
Creatinine, Ser: 0.76 mg/dL (ref 0.61–1.24)
Creatinine, Ser: 0.77 mg/dL (ref 0.61–1.24)
GFR calc Af Amer: >60 mL/min (ref >60)
GFR calc Af Amer: >60 mL/min (ref >60)
GFR calc non Af Amer: >60 mL/min (ref >60)
GFR calc non Af Amer: >60 mL/min (ref >60)
Glucose, Bld: 201 mg/dL — ABNORMAL HIGH (ref 70–99)
Glucose, Bld: 184 mg/dL — ABNORMAL HIGH (ref 70–99)
Potassium: 2.9 mmol/L — ABNORMAL LOW (ref 3.5–5.1)
Potassium: 3 mmol/L — ABNORMAL LOW (ref 3.5–5.1)
Sodium: 136 mmol/L (ref 135–145)
Sodium: 134 mmol/L — ABNORMAL LOW (ref 135–145)

## 2019-05-02 LAB — CBC WITH DIFFERENTIAL/PLATELET
Abs Immature Granulocytes: 0.01 10*3/uL (ref 0.00–0.07)
Basophils Absolute: 0 10*3/uL (ref 0.0–0.1)
Basophils Relative: 1 %
Eosinophils Absolute: 0.2 10*3/uL (ref 0.0–0.5)
Eosinophils Relative: 3 %
HCT: 41.9 % (ref 39.0–52.0)
Hemoglobin: 13.4 g/dL (ref 13.0–17.0)
Immature Granulocytes: 0 %
Lymphocytes Relative: 35 %
Lymphs Abs: 2.1 10*3/uL (ref 0.7–4.0)
MCH: 26.9 pg (ref 26.0–34.0)
MCHC: 32 g/dL (ref 30.0–36.0)
MCV: 84 fL (ref 80.0–100.0)
Monocytes Absolute: 0.6 10*3/uL (ref 0.1–1.0)
Monocytes Relative: 10 %
Neutro Abs: 3.1 10*3/uL (ref 1.7–7.7)
Neutrophils Relative %: 51 %
Platelets: 201 10*3/uL (ref 150–400)
RBC: 4.99 MIL/uL (ref 4.22–5.81)
RDW: 14.6 % (ref 11.5–15.5)
WBC: 6 10*3/uL (ref 4.0–10.5)
nRBC: 0 % (ref 0.0–0.2)

## 2019-05-02 LAB — BLOOD CULTURE ID PANEL (REFLEXED)

## 2019-05-02 LAB — GLUCOSE, CAPILLARY
Glucose-Capillary: 190 mg/dL — ABNORMAL HIGH (ref 70–99)
Glucose-Capillary: 209 mg/dL — ABNORMAL HIGH (ref 70–99)
Glucose-Capillary: 166 mg/dL — ABNORMAL HIGH (ref 70–99)

## 2019-05-02 LAB — MAGNESIUM: Magnesium: 1.8 mg/dL (ref 1.7–2.4)

## 2019-05-02 MED ORDER — NOVOLIN 70/30 FLEXPEN RELION (70-30) 100 UNIT/ML ~~LOC~~ SUPN: 23.0000 [IU] | PEN_INJECTOR | Freq: Two times a day (BID) | SUBCUTANEOUS | 0 refills | Status: DC

## 2019-05-02 MED ORDER — POTASSIUM CHLORIDE ER 20 MEQ PO TBCR: 20.0000 meq | EXTENDED_RELEASE_TABLET | Freq: Every day | ORAL | 0 refills | Status: DC

## 2019-05-02 MED ORDER — CLINDAMYCIN HCL 300 MG PO CAPS: 600.0000 mg | ORAL_CAPSULE | Freq: Three times a day (TID) | ORAL | Status: DC

## 2019-05-02 MED ORDER — ASPIRIN 81 MG PO TBEC: 81.0000 mg | DELAYED_RELEASE_TABLET | Freq: Every day | ORAL | 0 refills | Status: AC

## 2019-05-02 MED ORDER — INSULIN PEN NEEDLE 31G X 5 MM MISC: 1.0000 | Freq: Two times a day (BID) | 0 refills | Status: DC

## 2019-05-02 MED ORDER — METOPROLOL TARTRATE 50 MG PO TABS: 50.0000 mg | ORAL_TABLET | Freq: Two times a day (BID) | ORAL | 0 refills | Status: AC

## 2019-05-02 MED ORDER — POLYETHYLENE GLYCOL 3350 17 G PO PACK: 17.0000 g | PACK | Freq: Every day | ORAL | 0 refills | Status: AC | PRN

## 2019-05-02 MED ORDER — CLINDAMYCIN HCL 300 MG PO CAPS: 600.0000 mg | ORAL_CAPSULE | Freq: Three times a day (TID) | ORAL | 0 refills | Status: AC

## 2019-05-02 MED ORDER — POTASSIUM CHLORIDE CRYS ER 20 MEQ PO TBCR
40.0000 meq | EXTENDED_RELEASE_TABLET | ORAL | Status: AC
  Administered 2019-05-02 (×2): 40 meq via ORAL
  Filled 2019-05-02 (×2): qty 2

## 2019-05-02 MED ORDER — INSULIN ASPART PROT & ASPART (70-30 MIX) 100 UNIT/ML ~~LOC~~ SUSP
23.0000 [IU] | Freq: Two times a day (BID) | SUBCUTANEOUS | Status: DC
  Administered 2019-05-02 (×2): 23 [IU] via SUBCUTANEOUS
  Filled 2019-05-02: qty 10

## 2019-05-02 MED ORDER — BLOOD GLUCOSE METER KIT: PACK | 0 refills | Status: AC

## 2019-05-02 MED ORDER — ATORVASTATIN CALCIUM 20 MG PO TABS: 20.0000 mg | ORAL_TABLET | Freq: Every day | ORAL | 0 refills | Status: DC

## 2019-05-02 NOTE — Discharge Instructions (Signed)
Wound Care, Adult Taking care of your wound properly can help to prevent pain, infection, and scarring. It can also help your wound to heal more quickly. How to care for your wound Wound care      Follow instructions from your health care provider about how to take care of your wound. Make sure you: ? Wash your hands with soap and water before you change the bandage (dressing). If soap and water are not available, use hand sanitizer. ? Change your dressing as told by your health care provider. ? Leave stitches (sutures), skin glue, or adhesive strips in place. These skin closures may need to stay in place for 2 weeks or longer. If adhesive strip edges start to loosen and curl up, you may trim the loose edges. Do not remove adhesive strips completely unless your health care provider tells you to do that.  Check your wound area every day for signs of infection. Check for: ? Redness, swelling, or pain. ? Fluid or blood. ? Warmth. ? Pus or a bad smell.  Ask your health care provider if you should clean the wound with mild soap and water. Doing this may include: ? Using a clean towel to pat the wound dry after cleaning it. Do not rub or scrub the wound. ? Applying a cream or ointment. Do this only as told by your health care provider. ? Covering the incision with a clean dressing.  Ask your health care provider when you can leave the wound uncovered.  Keep the dressing dry until your health care provider says it can be removed. Do not take baths, swim, use a hot tub, or do anything that would put the wound underwater until your health care provider approves. Ask your health care provider if you can take showers. You may only be allowed to take sponge baths. Medicines   If you were prescribed an antibiotic medicine, cream, or ointment, take or use the antibiotic as told by your health care provider. Do not stop taking or using the antibiotic even if your condition improves.  Take  over-the-counter and prescription medicines only as told by your health care provider. If you were prescribed pain medicine, take it 30 or more minutes before you do any wound care or as told by your health care provider. General instructions  Return to your normal activities as told by your health care provider. Ask your health care provider what activities are safe.  Do not scratch or pick at the wound.  Do not use any products that contain nicotine or tobacco, such as cigarettes and e-cigarettes. These may delay wound healing. If you need help quitting, ask your health care provider.  Keep all follow-up visits as told by your health care provider. This is important.  Eat a diet that includes protein, vitamin A, vitamin C, and other nutrient-rich foods to help the wound heal. ? Foods rich in protein include meat, dairy, beans, nuts, and other sources. ? Foods rich in vitamin A include carrots and dark green, leafy vegetables. ? Foods rich in vitamin C include citrus, tomatoes, and other fruits and vegetables. ? Nutrient-rich foods have protein, carbohydrates, fat, vitamins, or minerals. Eat a variety of healthy foods including vegetables, fruits, and whole grains. Contact a health care provider if:  You received a tetanus shot and you have swelling, severe pain, redness, or bleeding at the injection site.  Your pain is not controlled with medicine.  You have redness, swelling, or pain around the wound.    You have fluid or blood coming from the wound.  Your wound feels warm to the touch.  You have pus or a bad smell coming from the wound.  You have a fever or chills.  You are nauseous or you vomit.  You are dizzy. Get help right away if:  You have a red streak going away from your wound.  The edges of the wound open up and separate.  Your wound is bleeding, and the bleeding does not stop with gentle pressure.  You have a rash.  You faint.  You have trouble breathing.  Summary  Always wash your hands with soap and water before changing your bandage (dressing).  To help with healing, eat foods that are rich in protein, vitamin A, vitamin C, and other nutrients.  Check your wound every day for signs of infection. Contact your health care provider if you suspect that your wound is infected. This information is not intended to replace advice given to you by your health care provider. Make sure you discuss any questions you have with your health care provider. Document Released: 04/25/2008 Document Revised: 11/04/2018 Document Reviewed: 02/01/2016 Elsevier Patient Education  2020 [employer]  

## 2019-05-02 NOTE — Progress Notes (Signed)
PHARMACY - PHYSICIAN COMMUNICATION CRITICAL VALUE ALERT - BLOOD CULTURE IDENTIFICATION (BCID)  George Sanders. is an 28 y.o. male who presented to [facility] on 04/29/2019 with a chief complaint of abdominal pain, constipation and n/v.  He was found to be in DKA and have scrotal abscess. He's currently on clindamycin IV for abscess  Name of physician (or Provider) Contacted: Dr. Posey Pronto  Current antibiotics: clindamycin 600 mg IV q8h  Changes to prescribed antibiotics recommended:  - suspects contamination - no change to current abx  Results for orders placed or performed during the hospital encounter of 04/29/19  Blood Culture ID Panel (Reflexed) (Collected: 04/30/2019  4:27 AM)  Result Value Ref Range   Enterococcus species NOT DETECTED NOT DETECTED   Listeria monocytogenes NOT DETECTED NOT DETECTED   Staphylococcus species DETECTED (A) NOT DETECTED   Staphylococcus aureus (BCID) NOT DETECTED NOT DETECTED   Methicillin resistance DETECTED (A) NOT DETECTED   Streptococcus species NOT DETECTED NOT DETECTED   Streptococcus agalactiae NOT DETECTED NOT DETECTED   Streptococcus pneumoniae NOT DETECTED NOT DETECTED   Streptococcus pyogenes NOT DETECTED NOT DETECTED   Acinetobacter baumannii NOT DETECTED NOT DETECTED   Enterobacteriaceae species NOT DETECTED NOT DETECTED   Enterobacter cloacae complex NOT DETECTED NOT DETECTED   Escherichia coli NOT DETECTED NOT DETECTED   Klebsiella oxytoca NOT DETECTED NOT DETECTED   Klebsiella pneumoniae NOT DETECTED NOT DETECTED   Proteus species NOT DETECTED NOT DETECTED   Serratia marcescens NOT DETECTED NOT DETECTED   Haemophilus influenzae NOT DETECTED NOT DETECTED   Neisseria meningitidis NOT DETECTED NOT DETECTED   Pseudomonas aeruginosa NOT DETECTED NOT DETECTED   Candida albicans NOT DETECTED NOT DETECTED   Candida glabrata NOT DETECTED NOT DETECTED   Candida krusei NOT DETECTED NOT DETECTED   Candida parapsilosis NOT DETECTED NOT DETECTED    Candida tropicalis NOT DETECTED NOT DETECTED    [name] 05/02/2019  12:04 PM

## 2019-05-03 NOTE — Discharge Summary (Signed)
[facility] Discharge Summary   Patient: George Sanders. TFT:[id number]   PCP: System, Provider Not In DOB: 12-19-1990   Date of admission: 04/29/2019   Date of discharge: 05/02/2019     Discharge Diagnoses:  Principal Problem:   DKA (diabetic ketoacidoses) ([place]) Active Problems:   New onset type 2 diabetes mellitus ([place])   Abscess of scrotum   Abnormal LFTs   Abdominal pain   Chest pain   Elevated blood pressure reading   Admitted From: home Disposition:  Home   Recommendations for Outpatient Follow-up:  1. PCP: Follow-up with PCP in 1 week. 2. Follow up LABS/TEST:  none   Diet recommendation: Carb modified diet  Activity: The patient is advised to gradually reintroduce usual activities,as tolerated .  Discharge Condition: good  Code Status: Full code   History of present illness: As per the H and P dictated on admission, " George Sanders. is a 28 y.o. male  without significant past medical history except for obesity, who presents with nausea,vomiting, upper abdominal pain, generalized weakness, constipation, headache.  Pt sates that he has been having nausea, vomiting, abdominal pain for more than 4 days.  Abdominal pain is located in the middle abdomen, constant, 8 out of 10 in severity, sharp, nonradiating.  He has been having nonbilious and nonbloody vomiting.  He vomited once today.  Patient is constipated.  Last bowel movement was 4 days ago.  Patient also complains of chest pain, which is located in the central chest, 7 out of 10 severity, pressure-like, radiating to the throat. No tenderness in the calf areas.  No recent long distance traveling.  Chest pain is not pleuritic. He does not have shortness of breath.  He has mild cough, but no fever or chills.  He has polyuria and polydipsia. No dysuria.  Patient has scrotum abscess which is actively draining in the past 2 days. He reports headache and feeling weak x 4-5 days. Per EDP, his mom checked his BG at  home and it was high in the 300s so she gave him unknown amount of unknown type of insulin.   ED Course: pt was found to have DKA with blood sugar 314, bicarbonate 12, anion gap of 19, abnormal liver function (ALP 68, AST 71, ALT 59, total bilirubin 1.2), negative Covid19,lactic acid 1.6, WBC 6.7, creatinine 1.02, BUN 8, temperature normal, blood pressure 118/74, heart rate 126, RR 24, oxygen saturation 99% on room air. VBG showed pH of 7.225, CO2 31.5, O2 39. Pt is admitted to SDU as inpt."  Hospital Course:  Summary of his active problems in the hospital is as following. 1.  DKA. New onset type 2 diabetes mellitus Hemoglobin A1c significantly elevated. Presented with anion gap of 19. Currently anion gap closed. Still has some acidosis on labs. Treated with IV insulin protocol. Currently transitioning to subcutaneous insulin. Basal bolus regimen. Outpatient follow-up with PCP recommended. Diabetes educator assistance appreciated.  2.  Accelerated hypertension. Blood pressure significantly elevated. Along with sinus tachycardia. Continue Lopressor.  3.  Abnormal EKG. Chest pain Patient had some chest pain on admission.  EKG also had some concern for ST elevation. Cardiology was consulted echocardiogram showed no significant abnormality. Troponins were unremarkable as well. No further cardiac work-up in the hospital.  4.  Scrotal abscess  Treated with IV clindamycin. We will continue to monitor and follow-up on cultures. CT abdomen pelvis with contrast was negative for any evidence of Fournier's gangrene. Although patient remains at risk for further worsening.  Dressing changes per wound care. Continue clindamycin on discharge  5. Body mass index is 50.05 kg/m.  Continue to engage in discussion regarding diet and exercise.  Personally discussed with social worker Caryl Pina to arrange for outpatient PCP for this patient.  Patient was ambulatory without any assistance. On  the day of the discharge the patient's vitals were stable, and no other acute medical condition were reported by patient. the patient was felt safe to be discharge at Home with no therapy needed on discharge.  Consultants: none Procedures: none  DISCHARGE MEDICATION: Allergies as of 05/02/2019   No Known Allergies     Medication List    STOP taking these medications   aspirin 325 MG tablet Replaced by: aspirin 81 MG EC tablet     TAKE these medications   aspirin 81 MG EC tablet Take 1 tablet (81 mg total) by mouth daily. Replaces: aspirin 325 MG tablet   atorvastatin 20 MG tablet Commonly known as: Lipitor Take 1 tablet (20 mg total) by mouth daily at 6 PM.   blood glucose meter kit and supplies Dispense based on patient and insurance preference. Use up to four times daily as directed. (FOR ICD-10 E10.9, E11.9).   clindamycin 300 MG capsule Commonly known as: CLEOCIN Take 2 capsules (600 mg total) by mouth 3 (three) times daily for 3 days.   Insulin Pen Needle 31G X 5 MM Misc 1 Act by Does not apply route 2 (two) times daily before a meal.   metoprolol tartrate 50 MG tablet Commonly known as: LOPRESSOR Take 1 tablet (50 mg total) by mouth 2 (two) times daily.   NovoLIN 70/30 FlexPen Relion (70-30) 100 UNIT/ML PEN Generic drug: Insulin Isophane & Regular Human Inject 23 Units into the skin 2 (two) times daily before a meal.   polyethylene glycol 17 g packet Commonly known as: MIRALAX / GLYCOLAX Take 17 g by mouth daily as needed.   Potassium Chloride ER 20 MEQ Tbcr Take 20 mEq by mouth daily.      No Known Allergies Discharge Instructions    Ambulatory referral to Nutrition and Diabetic Education   Complete by: As directed    Diet Carb Modified   Complete by: As directed    Increase activity slowly   Complete by: As directed      Discharge Exam: Filed Weights   04/29/19 1642  Weight: (!) 167.4 kg   Vitals:   05/02/19 1125 05/02/19 1620  BP: (!)  130/95 (!) 141/85  Pulse: 94 94  Resp: 17 18  Temp: 99.3 F (37.4 C) 98.4 F (36.9 C)  SpO2: 95% 98%   General: Appear in no distress, no Rash; Oral Mucosa Clear, moist. no Abnormal Mass Or lumps Cardiovascular: S1 and S2 Present, no Murmur, Respiratory: normal respiratory effort, Bilateral Air entry present and Clear to Auscultation, no Crackles, no wheezes Abdomen: Bowel Sound present, Soft and no tenderness, no hernia Extremities: no Pedal edema, no calf tenderness Neurology: alert and oriented to time, place, and person affect appropriate.  The results of significant diagnostics from this hospitalization (including imaging, microbiology, ancillary and laboratory) are listed below for reference.    Significant Diagnostic Studies: Ct Abdomen Pelvis W Contrast  Result Date: 04/30/2019 CLINICAL DATA:  Constipation, open wound of the scrotum. Epigastric pain. EXAM: CT ABDOMEN AND PELVIS WITH CONTRAST TECHNIQUE: Multidetector CT imaging of the abdomen and pelvis was performed using the standard protocol following bolus administration of intravenous contrast. CONTRAST:  130m OMNIPAQUE IOHEXOL 300 MG/ML  SOLN, 38m OMNIPAQUE IOHEXOL 300 MG/ML SOLN COMPARISON:  None. FINDINGS: Lower chest: No acute abnormality. Hepatobiliary: Diffuse low attenuation of the liver as can be seen with hepatic steatosis. No focal hepatic mass. Normal gallbladder. Pancreas: Choose Spleen: Normal in size without focal abnormality. Adrenals/Urinary Tract: Adrenal glands are unremarkable. Kidneys are normal, without renal calculi, focal lesion, or hydronephrosis. Bladder is unremarkable. Stomach/Bowel: Small hiatal hernia. Stomach is otherwise normal. Appendix appears normal. No evidence of bowel dilatation. Mild bowel wall thickening involving the duodenum as can be seen with duodenitis secondary to an infectious or inflammatory etiology. Moderate amount of stool throughout the colon. Vascular/Lymphatic: No significant  vascular findings are present. 16 mm mesenteric lymph node in the right lower quadrant likely reactive. No other enlarged lymph nodes. Reproductive: Prostate is unremarkable. Other: No abdominal wall hernia or abnormality. No abdominopelvic ascites. Musculoskeletal: No acute osseous abnormality. No aggressive osseous lesion. IMPRESSION: 1. Mild bowel wall thickening involving the duodenum as can be seen with duodenitis secondary to an infectious or inflammatory etiology. 2. Hepatic steatosis. Electronically Signed   By: HKathreen Devoid  On: 04/30/2019 16:53   UKoreaAbdomen Limited  Result Date: 04/29/2019 CLINICAL DATA:  28year old male with RIGHT UPPER quadrant abdominal pain for 4 days. EXAM: ULTRASOUND ABDOMEN LIMITED RIGHT UPPER QUADRANT COMPARISON:  None. FINDINGS: Gallbladder: Gallbladder is unremarkable. There is no evidence of cholelithiasis or acute cholecystitis. Common bile duct: Diameter: 4 mm.  No intrahepatic or extrahepatic biliary dilatation. Liver: Diffuse liver is compatible increased echogenicity with hepatic steatosis of the. No definite focal hepatic abnormalities are noted. Portal vein is patent on color Doppler imaging with normal direction of blood flow towards the liver. Other: None. IMPRESSION: 1. No acute abnormality.  Normal gallbladder. 2. Hepatic steatosis Electronically Signed   By: JMargarette CanadaM.D.   On: 04/29/2019 21:18   Dg Chest Port 1 View  Result Date: 04/30/2019 CLINICAL DATA:  Chest pain EXAM: PORTABLE CHEST 1 VIEW COMPARISON:  10/20/2018 FINDINGS: Low lung volumes. Heart and mediastinal contours are within normal limits. No focal opacities or effusions. No acute bony abnormality. IMPRESSION: Low volumes.  No active cardiopulmonary disease. Electronically Signed   By: KRolm BaptiseM.D.   On: 04/30/2019 08:15   UKoreaScrotum W/doppler  Result Date: 04/29/2019 CLINICAL DATA:  Actively draining left scrotum. EXAM: SCROTAL ULTRASOUND DOPPLER ULTRASOUND OF THE TESTICLES  TECHNIQUE: Complete ultrasound examination of the testicles, epididymis, and other scrotal structures was performed. Color and spectral Doppler ultrasound were also utilized to evaluate blood flow to the testicles. COMPARISON:  None. FINDINGS: Right testicle Measurements: 4.2 x 2.5 x 1.9 cm. No mass or microlithiasis visualized. Left testicle Measurements: 4.7 x 2.8 x 2.0 cm. No mass or microlithiasis visualized. Right epididymis:  Normal in size and appearance. Left epididymis:  Normal in size and appearance. Hydrocele:  None visualized. Varicocele:  None visualized. Pulsed Doppler interrogation of both testes demonstrates normal low resistance arterial and venous waveforms bilaterally. Ill-defined hypoechoic area is seen involving the left scrotal wall concerning for small abscess. IMPRESSION: No evidence of testicular mass or torsion. Ill-defined hypoechoic area is seen in left scrotal wall concerning for small abscess. Electronically Signed   By: JMarijo ConceptionM.D.   On: 04/29/2019 21:28    Microbiology: Recent Results (from the past 240 hour(s))  S[facility] Coronavirus 2 [facility] Nasopharyngeal Nasopharyngeal Swab     Status: None   Collection Time: 04/29/19  8:15 PM  Specimen: Nasopharyngeal Swab  Result Value Ref Range Status   SARS Coronavirus 2 NEGATIVE NEGATIVE Final    Comment: (NOTE) If result is NEGATIVE SARS-CoV-2 target nucleic acids are NOT DETECTED. The SARS-CoV-2 RNA is generally detectable in upper and lower  respiratory specimens during the acute phase of infection. The lowest  concentration of SARS-CoV-2 viral copies this assay can detect is 250  copies / mL. A negative result does not preclude SARS-CoV-2 infection  and should not be used as the sole basis for treatment or other  patient management decisions.  A negative result may occur with  improper specimen collection / handling, submission of specimen other  than nasopharyngeal  swab, presence of viral mutation(s) within the  areas targeted by this assay, and inadequate number of viral copies  (<250 copies / mL). A negative result must be combined with clinical  observations, patient history, and epidemiological information. If result is POSITIVE SARS-CoV-2 target nucleic acids are DETECTED. The SARS-CoV-2 RNA is generally detectable in upper and lower  respiratory specimens dur ing the acute phase of infection.  Positive  results are indicative of active infection with SARS-CoV-2.  Clinical  correlation with patient history and other diagnostic information is  necessary to determine patient infection status.  Positive results do  not rule out bacterial infection or co-infection with other viruses. If result is PRESUMPTIVE POSTIVE SARS-CoV-2 nucleic acids MAY BE PRESENT.   A presumptive positive result was obtained on the submitted specimen  and confirmed on repeat testing.  While 2019 novel coronavirus  (SARS-CoV-2) nucleic acids may be present in the submitted sample  additional confirmatory testing may be necessary for epidemiological  and / or clinical management purposes  to differentiate between  SARS-CoV-2 and other Sarbecovirus currently known to infect humans.  If clinically indicated additional testing with an alternate test  methodology [phone]) is advised. The SARS-CoV-2 RNA is generally  detectable in upper and lower respiratory sp ecimens during the acute  phase of infection. The expected result is Negative. Fact Sheet for Patients:  StrictlyIdeas.no Fact Sheet for Healthcare Providers: BankingDealers.co.za This test is not yet approved or cleared by the [place] FDA and has been authorized for detection and/or diagnosis of SARS-CoV-2 by FDA under an Emergency Use Authorization (EUA).  This EUA will remain in effect (meaning this test can be used) for the duration of the COVID-19 declaration  under Section 564(b)(1) of the Act, 21 U.S.C. section 360bbb-3(b)(1), unless the authorization is terminated or revoked sooner. Performed at [facility], [address] [place], [place] [address]   MRSA PCR Screening     Status: None   Collection Time: 04/30/19  3:58 AM   Specimen: Nasal Mucosa; Nasopharyngeal  Result Value Ref Range Status   MRSA by PCR NEGATIVE NEGATIVE Final    Comment:        The GeneXpert MRSA Assay (FDA approved for NASAL specimens only), is one component of a comprehensive MRSA colonization surveillance program. It is not intended to diagnose MRSA infection nor to guide or monitor treatment for MRSA infections. Performed at [facility], [place] [address], [place], [place] [address]   Culture, blood (Routine X 2) w Reflex to ID Panel     Status: Abnormal (Preliminary result)   Collection Time: 04/30/19  4:27 AM   Specimen: BLOOD RIGHT HAND  Result Value Ref Range Status   Specimen Description   Final    BLOOD RIGHT HAND Performed at [facility]  [facility], [place] [address], [place], [place] [address]    Special Requests   Final    BOTTLES DRAWN AEROBIC AND ANAEROBIC Blood Culture adequate volume Performed at [place] [address], [place], [place] [address]    Culture  Setup Time   Final    ANAEROBIC BOTTLE ONLY CRITICAL RESULT CALLED TO, READ BACK BY AND VERIFIED WITH: Christean Grief PharmD 11:50 05/02/19 ([email or web address]) GRAM POSITIVE COCCI    Culture (A)  Final    STAPHYLOCOCCUS SPECIES (COAGULASE NEGATIVE) THE SIGNIFICANCE OF ISOLATING THIS ORGANISM FROM A SINGLE SET OF BLOOD CULTURES WHEN MULTIPLE SETS ARE DRAWN IS UNCERTAIN. PLEASE NOTIFY THE MICROBIOLOGY DEPARTMENT WITHIN ONE WEEK IF SPECIATION AND SENSITIVITIES ARE REQUIRED. Performed at [facility], [place] [address], [place], [place] [address]    Report Status PENDING  Incomplete  Culture, blood (Routine X 2) w Reflex to ID Panel      Status: None (Preliminary result)   Collection Time: 04/30/19  4:27 AM   Specimen: BLOOD LEFT HAND  Result Value Ref Range Status   Specimen Description   Final    BLOOD LEFT HAND Performed at [place] [address] [place], [place] [address]    Special Requests   Final    BOTTLES DRAWN AEROBIC AND ANAEROBIC Blood Culture adequate volume Performed at [place] [address], [place], [place] [address]    Culture   Final    NO GROWTH 3 DAYS Performed at [facility], [place] [address], [place], [place] [address]    Report Status PENDING  Incomplete  Blood Culture ID Panel (Reflexed)     Status: Abnormal   Collection Time: 04/30/19  4:27 AM  Result Value Ref Range Status   Enterococcus species NOT DETECTED NOT DETECTED Final   Listeria monocytogenes NOT DETECTED NOT DETECTED Final   Staphylococcus species DETECTED (A) NOT DETECTED Final    Comment: Methicillin (oxacillin) resistant coagulase negative staphylococcus. Possible blood culture contaminant (unless isolated from more than one blood culture draw or clinical case suggests pathogenicity). No antibiotic treatment is indicated for blood  culture contaminants. CRITICAL RESULT CALLED TO, READ BACK BY AND VERIFIED WITH: Christean Grief PharmD 11:50 05/02/19 ([email or web address])    Staphylococcus aureus (BCID) NOT DETECTED NOT DETECTED Final   Methicillin resistance DETECTED (A) NOT DETECTED Final    Comment: CRITICAL RESULT CALLED TO, READ BACK BY AND VERIFIED WITH: Christean Grief PharmD 11:50 05/02/19 ([email or web address])    Streptococcus species NOT DETECTED NOT DETECTED Final   Streptococcus agalactiae NOT DETECTED NOT DETECTED Final   Streptococcus pneumoniae NOT DETECTED NOT DETECTED Final   Streptococcus pyogenes NOT DETECTED NOT DETECTED Final   Acinetobacter baumannii NOT DETECTED NOT DETECTED Final   Enterobacteriaceae species NOT DETECTED NOT DETECTED Final   Enterobacter cloacae complex NOT DETECTED  NOT DETECTED Final   Escherichia coli NOT DETECTED NOT DETECTED Final   Klebsiella oxytoca NOT DETECTED NOT DETECTED Final   Klebsiella pneumoniae NOT DETECTED NOT DETECTED Final   Proteus species NOT DETECTED NOT DETECTED Final   Serratia marcescens NOT DETECTED NOT DETECTED Final   Haemophilus influenzae NOT DETECTED NOT DETECTED Final   Neisseria meningitidis NOT DETECTED NOT DETECTED Final   Pseudomonas aeruginosa NOT DETECTED NOT DETECTED Final   Candida albicans NOT DETECTED NOT DETECTED Final   Candida glabrata NOT DETECTED NOT DETECTED Final   Candida krusei NOT DETECTED NOT DETECTED Final   Candida parapsilosis NOT DETECTED NOT DETECTED  Final   Candida tropicalis NOT DETECTED NOT DETECTED Final    Comment: Performed at [facility], [place] [address], [place], [place] [address]     Labs: CBC: Recent Labs  Lab 04/29/19 1832 04/29/19 1842 05/01/19 0154 05/02/19 0402  WBC 6.9  --  8.1 6.0  NEUTROABS 4.3  --  5.4 3.1  HGB 15.0 16.7 13.6 13.4  HCT 48.0 49.0 44.0 41.9  MCV 85.7  --  86.4 84.0  PLT 200  --  178 499   Basic Metabolic Panel: Recent Labs  Lab 04/30/19 1128 04/30/19 1524 05/01/19 0154 05/02/19 0402 05/02/19 1613  NA 135 133* 133* 136 134*  K 3.2* 3.1* 3.4* 2.9* 3.0*  CL 109 108 106 106 105  CO2 12* 15* 15* 18* 21*  GLUCOSE 156* 180* 211* 201* 184*  BUN 5* 5* <5* 5* 6  CREATININE 0.82 0.74 0.84 0.76 0.77  CALCIUM 8.6* 8.5* 8.7* 9.3 9.0  MG  --   --  1.8 1.8  --    Liver Function Tests: Recent Labs  Lab 04/29/19 1832  AST 71*  ALT 59*  ALKPHOS 68  BILITOT 1.2  PROT 9.4*  ALBUMIN 4.5   Recent Labs  Lab 04/29/19 1832  LIPASE 22   No results for input(s): AMMONIA in the last 168 hours. Cardiac Enzymes: No results for input(s): CKTOTAL, CKMB, CKMBINDEX, TROPONINI in the last 168 hours. BNP (last 3 results) No results for input(s): BNP in the last 8760 hours. CBG: Recent Labs  Lab 05/01/19 1706 05/01/19 2151 05/02/19 0753  05/02/19 1235 05/02/19 1653  GLUCAP 215* 261* 190* 209* 166*    Time spent: 35 minutes  Signed:  Berle Mull  [facility]  05/03/2019 6:54 PM

## 2019-05-04 LAB — CULTURE, BLOOD (ROUTINE X 2): Special Requests: ADEQUATE

## 2019-05-05 LAB — CULTURE, BLOOD (ROUTINE X 2)
Culture: NO GROWTH
Special Requests: ADEQUATE

## 2019-10-24 ENCOUNTER — Emergency Department (HOSPITAL_BASED_OUTPATIENT_CLINIC_OR_DEPARTMENT_OTHER): Payer: Self-pay

## 2019-10-24 ENCOUNTER — Inpatient Hospital Stay (HOSPITAL_BASED_OUTPATIENT_CLINIC_OR_DEPARTMENT_OTHER)
Admission: EM | Admit: 2019-10-24 | Discharge: 2019-10-26 | DRG: 638 | Disposition: A | Discharge status: Home / Self Care | Payer: Self-pay | Attending: Internal Medicine | Admitting: Internal Medicine

## 2019-10-24 ENCOUNTER — Other Ambulatory Visit: Payer: Self-pay

## 2019-10-24 ENCOUNTER — Encounter (HOSPITAL_BASED_OUTPATIENT_CLINIC_OR_DEPARTMENT_OTHER): Payer: Self-pay

## 2019-10-24 DIAGNOSIS — Z87891 Personal history of nicotine dependence: Secondary | ICD-10-CM

## 2019-10-24 DIAGNOSIS — Z20822 Contact with and (suspected) exposure to covid-19: Secondary | ICD-10-CM | POA: Diagnosis present

## 2019-10-24 DIAGNOSIS — I10 Essential (primary) hypertension: Secondary | ICD-10-CM

## 2019-10-24 DIAGNOSIS — E1110 Type 2 diabetes mellitus with ketoacidosis without coma: Principal | ICD-10-CM | POA: Diagnosis present

## 2019-10-24 DIAGNOSIS — Z6841 Body Mass Index (BMI) 40.0 and over, adult: Secondary | ICD-10-CM

## 2019-10-24 DIAGNOSIS — R739 Hyperglycemia, unspecified: Secondary | ICD-10-CM

## 2019-10-24 DIAGNOSIS — R7401 Elevation of levels of liver transaminase levels: Secondary | ICD-10-CM

## 2019-10-24 DIAGNOSIS — E119 Type 2 diabetes mellitus without complications: Secondary | ICD-10-CM

## 2019-10-24 DIAGNOSIS — Z833 Family history of diabetes mellitus: Secondary | ICD-10-CM

## 2019-10-24 DIAGNOSIS — E785 Hyperlipidemia, unspecified: Secondary | ICD-10-CM

## 2019-10-24 DIAGNOSIS — Z7982 Long term (current) use of aspirin: Secondary | ICD-10-CM

## 2019-10-24 DIAGNOSIS — Z794 Long term (current) use of insulin: Secondary | ICD-10-CM

## 2019-10-24 HISTORY — DX: Type 2 diabetes mellitus without complications: E11.9

## 2019-10-24 LAB — CBC WITH DIFFERENTIAL/PLATELET
Abs Immature Granulocytes: 0.01 10*3/uL (ref 0.00–0.07)
Basophils Absolute: 0 10*3/uL (ref 0.0–0.1)
Basophils Relative: 1 %
Eosinophils Absolute: 0.1 10*3/uL (ref 0.0–0.5)
Eosinophils Relative: 1 %
HCT: 45.3 % (ref 39.0–52.0)
Hemoglobin: 14.7 g/dL (ref 13.0–17.0)
Immature Granulocytes: 0 %
Lymphocytes Relative: 31 %
Lymphs Abs: 1.7 10*3/uL (ref 0.7–4.0)
MCH: 26.3 pg (ref 26.0–34.0)
MCHC: 32.5 g/dL (ref 30.0–36.0)
MCV: 81 fL (ref 80.0–100.0)
Monocytes Absolute: 0.5 10*3/uL (ref 0.1–1.0)
Monocytes Relative: 8 %
Neutro Abs: 3.3 10*3/uL (ref 1.7–7.7)
Neutrophils Relative %: 59 %
Platelets: 209 10*3/uL (ref 150–400)
RBC: 5.59 MIL/uL (ref 4.22–5.81)
RDW: 14.4 % (ref 11.5–15.5)
WBC: 5.6 10*3/uL (ref 4.0–10.5)
nRBC: 0 % (ref 0.0–0.2)

## 2019-10-24 LAB — BASIC METABOLIC PANEL
Anion gap: 12 (ref 5–15)
BUN: 13 mg/dL (ref 6–20)
CO2: 24 mmol/L (ref 22–32)
Calcium: 9.7 mg/dL (ref 8.9–10.3)
Chloride: 104 mmol/L (ref 98–111)
Creatinine, Ser: 0.96 mg/dL (ref 0.61–1.24)
GFR calc Af Amer: >60 mL/min (ref >60)
GFR calc non Af Amer: >60 mL/min (ref >60)
Glucose, Bld: 206 mg/dL — ABNORMAL HIGH (ref 70–99)
Potassium: 3.8 mmol/L (ref 3.5–5.1)
Sodium: 140 mmol/L (ref 135–145)

## 2019-10-24 LAB — COMPREHENSIVE METABOLIC PANEL
ALT: 61 U/L — ABNORMAL HIGH (ref 0–44)
AST: 50 U/L — ABNORMAL HIGH (ref 15–41)
Albumin: 4.9 g/dL (ref 3.5–5.0)
Alkaline Phosphatase: 57 U/L (ref 38–126)
Anion gap: 19 — ABNORMAL HIGH (ref 5–15)
BUN: 17 mg/dL (ref 6–20)
CO2: 21 mmol/L — ABNORMAL LOW (ref 22–32)
Calcium: 10.3 mg/dL (ref 8.9–10.3)
Chloride: 95 mmol/L — ABNORMAL LOW (ref 98–111)
Creatinine, Ser: 1.12 mg/dL (ref 0.61–1.24)
GFR calc Af Amer: >60 mL/min (ref >60)
GFR calc non Af Amer: >60 mL/min (ref >60)
Glucose, Bld: 482 mg/dL — ABNORMAL HIGH (ref 70–99)
Potassium: 4.2 mmol/L (ref 3.5–5.1)
Sodium: 135 mmol/L (ref 135–145)
Total Bilirubin: 1.2 mg/dL (ref 0.3–1.2)
Total Protein: 9.6 g/dL — ABNORMAL HIGH (ref 6.5–8.1)

## 2019-10-24 LAB — POCT I-STAT EG7
Acid-Base Excess: 1 mmol/L (ref 0.0–2.0)
Bicarbonate: 26.2 mmol/L (ref 20.0–28.0)
Calcium, Ion: 1.26 mmol/L (ref 1.15–1.40)
HCT: 47 % (ref 39.0–52.0)
Hemoglobin: 16 g/dL (ref 13.0–17.0)
O2 Saturation: 92 %
Patient temperature: 98
Potassium: 4.7 mmol/L (ref 3.5–5.1)
Sodium: 138 mmol/L (ref 135–145)
TCO2: 27 mmol/L (ref 22–32)
pCO2, Ven: 41 mmHg — ABNORMAL LOW (ref 44.0–60.0)
pH, Ven: 7.412 (ref 7.250–7.430)
pO2, Ven: 61 mmHg — ABNORMAL HIGH (ref 32.0–45.0)

## 2019-10-24 LAB — URINALYSIS, ROUTINE W REFLEX MICROSCOPIC
Bilirubin Urine: NEGATIVE
Glucose, UA: >=500 mg/dL — AB
Ketones, ur: 40 mg/dL — AB
Leukocytes,Ua: NEGATIVE
Nitrite: NEGATIVE
Protein, ur: 100 mg/dL — AB
Specific Gravity, Urine: 1.025 (ref 1.005–1.030)
pH: 5.5 (ref 5.0–8.0)

## 2019-10-24 LAB — CBG MONITORING, ED
Glucose-Capillary: 493 mg/dL — ABNORMAL HIGH (ref 70–99)
Glucose-Capillary: 369 mg/dL — ABNORMAL HIGH (ref 70–99)
Glucose-Capillary: 327 mg/dL — ABNORMAL HIGH (ref 70–99)
Glucose-Capillary: 237 mg/dL — ABNORMAL HIGH (ref 70–99)
Glucose-Capillary: 199 mg/dL — ABNORMAL HIGH (ref 70–99)
Glucose-Capillary: 171 mg/dL — ABNORMAL HIGH (ref 70–99)
Glucose-Capillary: 187 mg/dL — ABNORMAL HIGH (ref 70–99)
Glucose-Capillary: 172 mg/dL — ABNORMAL HIGH (ref 70–99)
Glucose-Capillary: 176 mg/dL — ABNORMAL HIGH (ref 70–99)

## 2019-10-24 LAB — URINALYSIS, MICROSCOPIC (REFLEX)

## 2019-10-24 LAB — SARS CORONAVIRUS 2 (TAT 6-24 HRS): SARS Coronavirus 2: NEGATIVE

## 2019-10-24 LAB — BETA-HYDROXYBUTYRIC ACID: Beta-Hydroxybutyric Acid: 2.49 mmol/L — ABNORMAL HIGH (ref 0.05–0.27)

## 2019-10-24 MED ORDER — SODIUM CHLORIDE 0.9 % IV SOLN: INTRAVENOUS | Status: DC

## 2019-10-24 MED ORDER — DEXTROSE-NACL 5-0.45 % IV SOLN: INTRAVENOUS | Status: DC

## 2019-10-24 MED ORDER — INSULIN REGULAR(HUMAN) IN NACL 100-0.9 UT/100ML-% IV SOLN
INTRAVENOUS | Status: DC
  Administered 2019-10-24: 19 [IU]/h via INTRAVENOUS
  Administered 2019-10-25: 9.5 [IU]/h via INTRAVENOUS
  Filled 2019-10-24 (×2): qty 100

## 2019-10-24 MED ORDER — SODIUM CHLORIDE 0.9 % IV BOLUS
1000.0000 mL | Freq: Once | INTRAVENOUS | Status: AC
  Administered 2019-10-24: 1000 mL via INTRAVENOUS

## 2019-10-24 MED ORDER — DEXTROSE 50 % IV SOLN: 0.0000 mL | INTRAVENOUS | Status: DC | PRN

## 2019-10-24 MED ORDER — POTASSIUM CHLORIDE 10 MEQ/100ML IV SOLN
10.0000 meq | INTRAVENOUS | Status: AC
  Administered 2019-10-24 (×2): 10 meq via INTRAVENOUS
  Filled 2019-10-24 (×2): qty 100

## 2019-10-24 NOTE — ED Triage Notes (Signed)
Pt c/o elevated BS x 1 week-"in the 3s"-was "487" PTA-pt states he has new PCP with changes in meds x 1 month-NAD-steady gait

## 2019-10-24 NOTE — ED Provider Notes (Signed)
[facility] EMERGENCY DEPARTMENT Provider Note   CSN: [id number] Arrival date & time: 10/24/19  1426     History Chief Complaint  Patient presents with  . Hyperglycemia    George Sanders. is a 29 y.o. male with a past medical history of DM 2, hypertension, obesity with a BMI of 50 who presents today for evaluation of hyperglycemia. He reports that a few months ago his doctor took him off insulin and put him on Metformin.  He reports that he is taking 1000 mg twice daily.  He reports compliance and denies missing any doses.  He denies any fevers. He reports that over about the past week his blood sugars have been running significantly high.  He is checked his blood sugar 3 times in the past week and it has been running in the 300s.   He reports that normally he runs in the 200s, and while taking insulin was running in the 100-200s.   No N/V/D.  He reports that over the past week he has had polydipsia and polyuria.  He denies any wounds, abdominal pain, chest pain or other symptoms.   He reports that today he has been feeling generally weak and unwell which, combined with his hyperglycemia, prompted him to come in today.  HPI     Past Medical History:  Diagnosis Date  . Diabetes mellitus without complication ([place])   . PNA (pneumonia)     Patient Active Problem List   Diagnosis Date Noted  . Hyperglycemia 10/24/2019  . Chest pain 04/30/2019  . Elevated blood pressure reading 04/30/2019  . DKA (diabetic ketoacidoses) ([place]) 04/29/2019  . New onset type 2 diabetes mellitus ([place]) 04/29/2019  . Abscess of scrotum 04/29/2019  . Abnormal LFTs 04/29/2019  . Abdominal pain 04/29/2019    History reviewed. No pertinent surgical history.     Family History  Problem Relation Age of Onset  . Diabetes Mellitus II Mother   . Diabetes Mellitus II Sister     Social History   Tobacco Use  . Smoking status: Former Smoker    Types: Cigars  . Smokeless tobacco: Never Used   Substance Use Topics  . Alcohol use: Not Currently  . Drug use: Never    Home Medications Prior to Admission medications   Medication Sig Start Date End Date Taking? Authorizing Provider  aspirin EC 81 MG EC tablet Take 1 tablet (81 mg total) by mouth daily. 05/03/19   Lavina Hamman, MD  atorvastatin (LIPITOR) 20 MG tablet Take 1 tablet (20 mg total) by mouth daily at 6 PM. 05/02/19 05/01/20  Lavina Hamman, MD  blood glucose meter kit and supplies Dispense based on patient and insurance preference. Use up to four times daily as directed. (FOR ICD-10 E10.9, E11.9). 05/02/19   Lavina Hamman, MD  Insulin Isophane & Regular Human (NOVOLIN 70/30 FLEXPEN RELION) (70-30) 100 UNIT/ML PEN Inject 23 Units into the skin 2 (two) times daily before a meal. 05/02/19   Lavina Hamman, MD  Insulin Pen Needle 31G X 5 MM MISC 1 Act by Does not apply route 2 (two) times daily before a meal. 05/02/19   Lavina Hamman, MD  metoprolol tartrate (LOPRESSOR) 50 MG tablet Take 1 tablet (50 mg total) by mouth 2 (two) times daily. 05/02/19   Lavina Hamman, MD  polyethylene glycol (MIRALAX / GLYCOLAX) 17 g packet Take 17 g by mouth daily as needed. 05/02/19   Lavina Hamman, MD  potassium  chloride 20 MEQ TBCR Take 20 mEq by mouth daily. 05/02/19   Lavina Hamman, MD    Allergies    Patient has no known allergies.  Review of Systems   Review of Systems  Constitutional: Negative for chills and fever.  HENT: Negative for congestion.   Gastrointestinal: Negative for abdominal pain and nausea.  Endocrine: Positive for polydipsia and polyuria.  Genitourinary: Positive for frequency. Negative for difficulty urinating and urgency.  Musculoskeletal: Negative for back pain and neck pain.  Neurological: Positive for weakness (Generalized). Negative for headaches.  All other systems reviewed and are negative.   Physical Exam Updated Vital Signs BP (!) 132/100   Pulse 98   Temp 98.9 F (37.2 C) (Oral)   Resp (!)  25   Ht 6' (1.829 m)   Wt (!) 166 kg   SpO2 93%   BMI 49.64 kg/m   Physical Exam Vitals and nursing note reviewed.  Constitutional:      Appearance: He is well-developed. He is obese.     Comments: Appears to feel unwell but is not in extremis.   HENT:     Head: Normocephalic and atraumatic.     Mouth/Throat:     Mouth: Mucous membranes are dry.  Eyes:     Conjunctiva/sclera: Conjunctivae normal.  Cardiovascular:     Rate and Rhythm: Regular rhythm. Tachycardia present.     Pulses: Normal pulses.     Heart sounds: Normal heart sounds. No murmur.  Pulmonary:     Effort: Pulmonary effort is normal. No respiratory distress.     Breath sounds: Normal breath sounds. No rhonchi.  Abdominal:     General: There is no distension.     Palpations: Abdomen is soft.     Tenderness: There is no abdominal tenderness. There is no guarding.  Musculoskeletal:     Cervical back: Normal range of motion and neck supple. No rigidity.  Skin:    General: Skin is warm and dry.  Neurological:     General: No focal deficit present.     Mental Status: He is alert.     Cranial Nerves: No cranial nerve deficit.  Psychiatric:        Mood and Affect: Mood normal.        Behavior: Behavior normal.     ED Results / Procedures / Treatments   Labs (all labs ordered are listed, but only abnormal results are displayed) Labs Reviewed  COMPREHENSIVE METABOLIC PANEL - Abnormal; Notable for the following components:      Result Value   Chloride 95 (*)    CO2 21 (*)    Glucose, Bld 482 (*)    Total Protein 9.6 (*)    AST 50 (*)    ALT 61 (*)    Anion gap 19 (*)    All other components within normal limits  URINALYSIS, ROUTINE W REFLEX MICROSCOPIC - Abnormal; Notable for the following components:   Glucose, UA >=500 (*)    Hgb urine dipstick SMALL (*)    Ketones, ur 40 (*)    Protein, ur 100 (*)    All other components within normal limits  BETA-HYDROXYBUTYRIC ACID - Abnormal; Notable for the  following components:   Beta-Hydroxybutyric Acid 2.49 (*)    All other components within normal limits  URINALYSIS, MICROSCOPIC (REFLEX) - Abnormal; Notable for the following components:   Bacteria, UA FEW (*)    All other components within normal limits  CBG MONITORING, ED - Abnormal; Notable  for the following components:   Glucose-Capillary 493 (*)    All other components within normal limits  POCT I-STAT EG7 - Abnormal; Notable for the following components:   pCO2, Ven 41.0 (*)    pO2, Ven 61.0 (*)    All other components within normal limits  CBG MONITORING, ED - Abnormal; Notable for the following components:   Glucose-Capillary 369 (*)    All other components within normal limits  CBG MONITORING, ED - Abnormal; Notable for the following components:   Glucose-Capillary 327 (*)    All other components within normal limits  CBG MONITORING, ED - Abnormal; Notable for the following components:   Glucose-Capillary 237 (*)    All other components within normal limits  CBG MONITORING, ED - Abnormal; Notable for the following components:   Glucose-Capillary 199 (*)    All other components within normal limits  CBG MONITORING, ED - Abnormal; Notable for the following components:   Glucose-Capillary 187 (*)    All other components within normal limits  CBG MONITORING, ED - Abnormal; Notable for the following components:   Glucose-Capillary 171 (*)    All other components within normal limits  CBG MONITORING, ED - Abnormal; Notable for the following components:   Glucose-Capillary 172 (*)    All other components within normal limits  SARS CORONAVIRUS 2 (TAT 6-24 HRS)  URINE CULTURE  CBC WITH DIFFERENTIAL/PLATELET  BASIC METABOLIC PANEL  I-STAT VENOUS BLOOD GAS, ED    EKG None  Radiology DG Chest Port 1 View  Result Date: 10/24/2019 CLINICAL DATA:  Tachycardia. EXAM: PORTABLE CHEST 1 VIEW COMPARISON:  April 30, 2019 FINDINGS: The heart size and mediastinal contours are  within normal limits. Both lungs are clear. The visualized skeletal structures are unremarkable. IMPRESSION: No active disease. Electronically Signed   By: Virgina Norfolk M.D.   On: 10/24/2019 17:46    Procedures .Critical Care Performed by: Lorin Glass, PA-C Authorized by: Lorin Glass, PA-C   Critical care provider statement:    Critical care time (minutes):  45   Critical care was time spent personally by me on the following activities:  Discussions with consultants, evaluation of patient's response to treatment, examination of patient, ordering and performing treatments and interventions, ordering and review of laboratory studies, ordering and review of radiographic studies, pulse oximetry, re-evaluation of patient's condition, obtaining history from patient or surrogate and review of old charts   (including critical care time)  Medications Ordered in ED Medications  insulin regular, human (MYXREDLIN) 100 units/ 100 mL infusion ( Intravenous Rate/Dose Verify 10/24/19 2132)  0.9 %  sodium chloride infusion ( Intravenous Stopped 10/24/19 1935)  dextrose 5 %-0.45 % sodium chloride infusion ( Intravenous New Bag/Given 10/24/19 1845)  dextrose 50 % solution 0-50 mL (has no administration in time range)  sodium chloride 0.9 % bolus 1,000 mL ( Intravenous Stopped 10/24/19 1644)  potassium chloride 10 mEq in 100 mL IVPB ( Intravenous Stopped 10/24/19 1931)  sodium chloride 0.9 % bolus 1,000 mL (0 mLs Intravenous Stopped 10/24/19 1848)    ED Course  I have reviewed the triage vital signs and the nursing notes.  Pertinent labs & imaging results that were available during my care of the patient were reviewed by me and considered in my medical decision making (see chart for details).    MDM Rules/Calculators/A&P                     Patient is a 29 year old man  who presents today for evaluation of hyperglycemia. On my exam he is tachycardic with dry mucous membranes.  Here his  blood sugar is 482.  He has an anion gap at 19 with a CO2 of 21.  His UA shows 40 ketones and over 500 glucose.  Beta hydroxybutyric acid is elevated at 2.49 concerning for DKA.  VBG shows a pH of 7.412, so I suspect metabolically he is compensating. Covid testing is negative.  He was started on IV insulin, given IV fluids and IV potassium.  Patient will be admitted to the hospital.  I spoke with Dr. Flossie Buffy who will admit patient.  Note: Portions of this report may have been transcribed using voice recognition software. Every effort was made to ensure accuracy; however, inadvertent computerized transcription errors may be present  Final Clinical Impression(s) / ED Diagnoses Final diagnoses:  Diabetic ketoacidosis without coma associated with type 2 diabetes mellitus ([place])  Hyperglycemia    Rx / DC Orders ED Discharge Orders    None       Lorin Glass, PA-C 10/24/19 2246    Fredia Sorrow, MD 11/05/19 [id number]

## 2019-10-25 DIAGNOSIS — E785 Hyperlipidemia, unspecified: Secondary | ICD-10-CM

## 2019-10-25 DIAGNOSIS — R7401 Elevation of levels of liver transaminase levels: Secondary | ICD-10-CM

## 2019-10-25 DIAGNOSIS — I10 Essential (primary) hypertension: Secondary | ICD-10-CM

## 2019-10-25 DIAGNOSIS — E1110 Type 2 diabetes mellitus with ketoacidosis without coma: Principal | ICD-10-CM

## 2019-10-25 LAB — GLUCOSE, CAPILLARY
Glucose-Capillary: 167 mg/dL — ABNORMAL HIGH (ref 70–99)
Glucose-Capillary: 195 mg/dL — ABNORMAL HIGH (ref 70–99)
Glucose-Capillary: 220 mg/dL — ABNORMAL HIGH (ref 70–99)
Glucose-Capillary: 154 mg/dL — ABNORMAL HIGH (ref 70–99)
Glucose-Capillary: 188 mg/dL — ABNORMAL HIGH (ref 70–99)
Glucose-Capillary: 314 mg/dL — ABNORMAL HIGH (ref 70–99)
Glucose-Capillary: 191 mg/dL — ABNORMAL HIGH (ref 70–99)
Glucose-Capillary: 203 mg/dL — ABNORMAL HIGH (ref 70–99)
Glucose-Capillary: 348 mg/dL — ABNORMAL HIGH (ref 70–99)
Glucose-Capillary: 303 mg/dL — ABNORMAL HIGH (ref 70–99)

## 2019-10-25 LAB — HEPATIC FUNCTION PANEL
ALT: 56 U/L — ABNORMAL HIGH (ref 0–44)
AST: 58 U/L — ABNORMAL HIGH (ref 15–41)
Albumin: 4.1 g/dL (ref 3.5–5.0)
Alkaline Phosphatase: 49 U/L (ref 38–126)
Bilirubin, Direct: 0.1 mg/dL (ref 0.0–0.2)
Indirect Bilirubin: 0.8 mg/dL (ref 0.3–0.9)
Total Bilirubin: 0.9 mg/dL (ref 0.3–1.2)
Total Protein: 8.3 g/dL — ABNORMAL HIGH (ref 6.5–8.1)

## 2019-10-25 LAB — BASIC METABOLIC PANEL
Anion gap: 14 (ref 5–15)
BUN: 15 mg/dL (ref 6–20)
CO2: 23 mmol/L (ref 22–32)
Calcium: 9.9 mg/dL (ref 8.9–10.3)
Chloride: 105 mmol/L (ref 98–111)
Creatinine, Ser: 0.98 mg/dL (ref 0.61–1.24)
GFR calc Af Amer: >60 mL/min (ref >60)
GFR calc non Af Amer: >60 mL/min (ref >60)
Glucose, Bld: 185 mg/dL — ABNORMAL HIGH (ref 70–99)
Potassium: 3.6 mmol/L (ref 3.5–5.1)
Sodium: 142 mmol/L (ref 135–145)

## 2019-10-25 LAB — MRSA PCR SCREENING: MRSA by PCR: NEGATIVE

## 2019-10-25 LAB — HEMOGLOBIN A1C
Hgb A1c MFr Bld: 11.1 % — ABNORMAL HIGH (ref 4.8–5.6)
Mean Plasma Glucose: 271.87 mg/dL

## 2019-10-25 LAB — BETA-HYDROXYBUTYRIC ACID: Beta-Hydroxybutyric Acid: 0.18 mmol/L (ref 0.05–0.27)

## 2019-10-25 MED ORDER — SODIUM CHLORIDE 0.9 % IV SOLN: INTRAVENOUS | Status: DC

## 2019-10-25 MED ORDER — INSULIN ASPART PROT & ASPART (70-30 MIX) 100 UNIT/ML ~~LOC~~ SUSP
12.0000 [IU] | Freq: Once | SUBCUTANEOUS | Status: AC
  Administered 2019-10-25: 12 [IU] via SUBCUTANEOUS
  Filled 2019-10-25: qty 10

## 2019-10-25 MED ORDER — INSULIN GLARGINE 100 UNIT/ML ~~LOC~~ SOLN
25.0000 [IU] | Freq: Every day | SUBCUTANEOUS | Status: DC
  Administered 2019-10-25: 25 [IU] via SUBCUTANEOUS
  Filled 2019-10-25: qty 0.25

## 2019-10-25 MED ORDER — METOPROLOL TARTRATE 50 MG PO TABS
50.0000 mg | ORAL_TABLET | Freq: Two times a day (BID) | ORAL | Status: DC
  Administered 2019-10-25 (×3): 50 mg via ORAL
  Filled 2019-10-25: qty 2
  Filled 2019-10-25: qty 1
  Filled 2019-10-25: qty 2

## 2019-10-25 MED ORDER — CHLORHEXIDINE GLUCONATE CLOTH 2 % EX PADS
6.0000 | MEDICATED_PAD | Freq: Every day | CUTANEOUS | Status: DC
  Administered 2019-10-25: 6 via TOPICAL

## 2019-10-25 MED ORDER — ENOXAPARIN SODIUM 80 MG/0.8ML ~~LOC~~ SOLN: 80.0000 mg | SUBCUTANEOUS | Status: DC

## 2019-10-25 MED ORDER — INSULIN ASPART 100 UNIT/ML ~~LOC~~ SOLN
0.0000 [IU] | Freq: Every day | SUBCUTANEOUS | Status: DC
  Administered 2019-10-25: 4 [IU] via SUBCUTANEOUS

## 2019-10-25 MED ORDER — ATORVASTATIN CALCIUM 20 MG PO TABS
20.0000 mg | ORAL_TABLET | Freq: Every day | ORAL | Status: DC
  Administered 2019-10-25: 20 mg via ORAL
  Filled 2019-10-25: qty 1

## 2019-10-25 MED ORDER — INSULIN ASPART PROT & ASPART (70-30 MIX) 100 UNIT/ML ~~LOC~~ SUSP
23.0000 [IU] | Freq: Two times a day (BID) | SUBCUTANEOUS | Status: DC
  Administered 2019-10-26: 23 [IU] via SUBCUTANEOUS
  Filled 2019-10-25: qty 10

## 2019-10-25 MED ORDER — INSULIN ASPART 100 UNIT/ML ~~LOC~~ SOLN
0.0000 [IU] | Freq: Three times a day (TID) | SUBCUTANEOUS | Status: DC
  Administered 2019-10-25 (×2): 11 [IU] via SUBCUTANEOUS
  Administered 2019-10-25: 3 [IU] via SUBCUTANEOUS
  Administered 2019-10-26: 8 [IU] via SUBCUTANEOUS

## 2019-10-25 MED ORDER — ATORVASTATIN CALCIUM 40 MG PO TABS: 40.0000 mg | ORAL_TABLET | Freq: Every day | ORAL | Status: DC

## 2019-10-25 MED ORDER — ENOXAPARIN SODIUM 40 MG/0.4ML ~~LOC~~ SOLN
40.0000 mg | SUBCUTANEOUS | Status: DC
  Administered 2019-10-25: 40 mg via SUBCUTANEOUS
  Filled 2019-10-25: qty 0.4

## 2019-10-25 MED ORDER — INSULIN ISOPHANE & REGULAR (HUMAN 70-30)100 UNIT/ML KWIKPEN: 23.0000 [IU] | PEN_INJECTOR | Freq: Two times a day (BID) | SUBCUTANEOUS | Status: DC

## 2019-10-25 MED ORDER — HYDRALAZINE HCL 20 MG/ML IJ SOLN: 5.0000 mg | INTRAMUSCULAR | Status: DC | PRN

## 2019-10-25 MED ORDER — ACETAMINOPHEN 325 MG PO TABS
650.0000 mg | ORAL_TABLET | Freq: Four times a day (QID) | ORAL | Status: DC | PRN
  Administered 2019-10-25: 650 mg via ORAL
  Filled 2019-10-25: qty 2

## 2019-10-25 MED ORDER — ACETAMINOPHEN 650 MG RE SUPP: 650.0000 mg | Freq: Four times a day (QID) | RECTAL | Status: DC | PRN

## 2019-10-25 NOTE — Progress Notes (Signed)
PROGRESS NOTE    George Sanders.  TZG:[id number] DOB: Aug 05, 1990 DOA: 10/24/2019 PCP: System, Provider Not In   Brief Narrative: 29 y.o. male with medical history significant of insulin-dependent diabetes, hypertension, hyperlipidemia, morbid obesity (BMI 49) presented to [facility] ED with complaints of hyperglycemia.  Patient states he was previously on insulin which was stopped by his PCP a month or two ago as his blood sugars has improved.  He was instead started on Metformin.  Does admit to eating a lot of candy and drinking sweet tea.  States for about a week he has not been feeling well.  Reports fatigue, polyuria, and polydipsia.  Also states that previously he was on a blood pressure medication which was also stopped by his PCP during his previous appointment.  He was also on a cholesterol medicine which he was not able to pick up from the pharmacy.  Denies fevers, chills, cough, shortness of breath, chest pain, nausea, vomiting, abdominal pain, or diarrhea.  ED Course: Afebrile.  Tachycardic.  Labs showing no leukocytosis.  Blood glucose 482.  Bicarb 21, anion gap 19.  UA with ketones.  Beta hydroxybutyric acid level elevated at 2.49.  VBG with pH 7.41.  Transaminases mildly elevated (AST 50, ALT 61).  SARS-CoV-2 PCR test negative. UA not suggestive of infection. Chest x-ray showing no active disease. Patient received 2 L IV fluid boluses.  He was started on insulin and IV fluid infusion per DKA protocol.  Received potassium supplementation.   Assessment & Plan:   Principal Problem:   DKA (diabetic ketoacidoses) ([place]) Active Problems:   New onset type 2 diabetes mellitus ([place])   Transaminitis   HTN (hypertension)   HLD (hyperlipidemia)   #1 DKA/uncontrolled type 2 diabetes-patient reports he was taking all his medications as prescribed.  He was on insulin before and that was stopped by his doctor few weeks ago and was started on Metformin for unclear reasons.  His  hemoglobin A1c is 11.4.  On admission his gap was 19 which is closed blood glucose was 482 down to high 100s to low 200s now.  He was treated with insulin and IV fluids. His DKA has resolved and he was started on Lantus. I will restart his home dose of NPH he will get half of NPH tonight and his regular dose will be started tomorrow morning Consulted case manager/social worker for discharge medication needs as he cannot afford his medications. Consulted diabetic coordinator. Transferred to [facility] today  #2 hypertension on metoprolol continue as needed hydralazine  #3 hyperlipidemia continue statin his last LDL was 148.  #4 transaminitis improving likely due to DKA recheck labs in a.m.   Estimated body mass index is 49.78 kg/m as calculated from the following:   Height as of this encounter: 6' (1.829 m).   Weight as of this encounter: 166.5 kg.  DVT prophylaxis: Lovenox Code Status: Full code Family Communication: None  disposition Plan: Patient came from home plan is to discharge him home Barriers to discharge uncontrolled hyperglycemia with DKA adjusting insulin Consults to be seen case Occupational hygienist for discharge medication needs as patient cannot afford it Consultants:   None  Procedures none Antimicrobials: None Subjective: He is resting in bed awake and alert Complains of being hungry and wants to eat he denies any nausea vomiting or abdominal pain  Objective: Vitals:   10/25/19 0800 10/25/19 0830 10/25/19 0900 10/25/19 1022  BP: (!) 148/120  (!) 151/98 138/78  Pulse: 89  87 (!) 102  Resp: (!) 9  17   Temp:  (!) 97.4 F (36.3 C)    TempSrc:  Oral    SpO2: 98%  93%   Weight:      Height:        Intake/Output Summary (Last 24 hours) at 10/25/2019 1123 Last data filed at 10/25/2019 0900 Gross per 24 hour  Intake 3964.5 ml  Output 1050 ml  Net 2914.5 ml   Filed Weights   10/24/19 1438 10/25/19 0100  Weight: (!) 166 kg (!) 166.5 kg     Examination:  General exam: Appears calm and comfortable  Respiratory system: Clear to auscultation. Respiratory effort normal. Cardiovascular system: S1 & S2 heard, RRR. No JVD, murmurs, rubs, gallops or clicks. No pedal edema. Gastrointestinal system: Abdomen is nondistended, soft and nontender. No organomegaly or masses felt. Normal bowel sounds heard. Central nervous system: Alert and oriented. No focal neurological deficits. Extremities: Symmetric 5 x 5 power. Skin: No rashes, lesions or ulcers Psychiatry: Judgement and insight appear normal. Mood & affect appropriate.     Data Reviewed: I have personally reviewed following labs and imaging studies  CBC: Recent Labs  Lab 10/24/19 1528 10/24/19 1530  WBC 5.6  --   NEUTROABS 3.3  --   HGB 14.7 16.0  HCT 45.3 47.0  MCV 81.0  --   PLT 209  --    Basic Metabolic Panel: Recent Labs  Lab 10/24/19 1528 10/24/19 1530 10/24/19 2245 10/25/19 0217  NA 135 138 140 142  K 4.2 4.7 3.8 3.6  CL 95*  --  104 105  CO2 21*  --  24 23  GLUCOSE 482*  --  206* 185*  BUN 17  --  13 15  CREATININE 1.12  --  0.96 0.98  CALCIUM 10.3  --  9.7 9.9   GFR: Estimated Creatinine Clearance: 182.9 mL/min (by C-G formula based on SCr of 0.98 mg/dL). Liver Function Tests: Recent Labs  Lab 10/24/19 1528 10/25/19 0647  AST 50* 58*  ALT 61* 56*  ALKPHOS 57 49  BILITOT 1.2 0.9  PROT 9.6* 8.3*  ALBUMIN 4.9 4.1   No results for input(s): LIPASE, AMYLASE in the last 168 hours. No results for input(s): AMMONIA in the last 168 hours. Coagulation Profile: No results for input(s): INR, PROTIME in the last 168 hours. Cardiac Enzymes: No results for input(s): CKTOTAL, CKMB, CKMBINDEX, TROPONINI in the last 168 hours. BNP (last 3 results) No results for input(s): PROBNP in the last 8760 hours. HbA1C: Recent Labs    10/25/19 0647  HGBA1C 11.1*   CBG: Recent Labs  Lab 10/25/19 0049 10/25/19 0159 10/25/19 0256 10/25/19 0344  10/25/19 0735  GLUCAP 220* 195* 167* 154* 188*   Lipid Profile: No results for input(s): CHOL, HDL, LDLCALC, TRIG, CHOLHDL, LDLDIRECT in the last 72 hours. Thyroid Function Tests: No results for input(s): TSH, T4TOTAL, FREET4, T3FREE, THYROIDAB in the last 72 hours. Anemia Panel: No results for input(s): VITAMINB12, FOLATE, FERRITIN, TIBC, IRON, RETICCTPCT in the last 72 hours. Sepsis Labs: No results for input(s): PROCALCITON, LATICACIDVEN in the last 168 hours.  Recent Results (from the past 240 hour(s))  SARS CORONAVIRUS 2 (TAT 6-24 HRS) Nasopharyngeal Nasopharyngeal Swab     Status: None   Collection Time: 10/24/19  3:29 PM   Specimen: Nasopharyngeal Swab  Result Value Ref Range Status   SARS Coronavirus 2 NEGATIVE NEGATIVE Final    Comment: (NOTE) SARS-CoV-2 target nucleic acids are NOT DETECTED. The SARS-CoV-2  RNA is generally detectable in upper and lower respiratory specimens during the acute phase of infection. Negative results do not preclude SARS-CoV-2 infection, do not rule out co-infections with other pathogens, and should not be used as the sole basis for treatment or other patient management decisions. Negative results must be combined with clinical observations, patient history, and epidemiological information. The expected result is Negative. Fact Sheet for Patients: HairSlick.no Fact Sheet for Healthcare Providers: quierodirigir.com This test is not yet approved or cleared by the [place] FDA and  has been authorized for detection and/or diagnosis of SARS-CoV-2 by FDA under an Emergency Use Authorization (EUA). This EUA will remain  in effect (meaning this test can be used) for the duration of the COVID-19 declaration under Section 56 4(b)(1) of the Act, 21 U.S.C. section 360bbb-3(b)(1), unless the authorization is terminated or revoked sooner. Performed at [facility], [address]  [place], [place] [address]   MRSA PCR Screening     Status: None   Collection Time: 10/25/19 12:51 AM   Specimen: Nasal Mucosa; Nasopharyngeal  Result Value Ref Range Status   MRSA by PCR NEGATIVE NEGATIVE Final    Comment:        The GeneXpert MRSA Assay (FDA approved for NASAL specimens only), is one component of a comprehensive MRSA colonization surveillance program. It is not intended to diagnose MRSA infection nor to guide or monitor treatment for MRSA infections. Performed at [facility], [address], [place], [place] [address]          Radiology Studies: [facility] Chest Port 1 View  Result Date: 10/24/2019 CLINICAL DATA:  Tachycardia. EXAM: PORTABLE CHEST 1 VIEW COMPARISON:  April 30, 2019 FINDINGS: The heart size and mediastinal contours are within normal limits. Both lungs are clear. The visualized skeletal structures are unremarkable. IMPRESSION: No active disease. Electronically Signed   By: Aram Candela M.D.   On: 10/24/2019 17:46        Scheduled Meds: . atorvastatin  20 mg Oral q1800  . Chlorhexidine Gluconate Cloth  6 each Topical Daily  . enoxaparin (LOVENOX) injection  40 mg Subcutaneous Q24H  . insulin aspart  0-15 Units Subcutaneous TID WC  . insulin aspart  0-5 Units Subcutaneous QHS  . insulin aspart protamine- aspart  12 Units Subcutaneous Once  . [START ON 10/26/2019] insulin aspart protamine- aspart  23 Units Subcutaneous BID WC  . metoprolol tartrate  50 mg Oral BID   Continuous Infusions: . insulin Stopped (10/25/19 0743)     LOS: 1 day     Alwyn Ren, MD  10/25/2019, 11:23 AM

## 2019-10-25 NOTE — H&P (Signed)
History and Physical    George Sanders. KWI:[id number] DOB: Feb 20, 1991 DOA: 10/24/2019  PCP: System, Provider Not In Patient coming from: [place]  Chief Complaint: Hyperglycemia  HPI: George Sanders. is a 29 y.o. male with medical history significant of insulin-dependent diabetes, hypertension, hyperlipidemia, morbid obesity (BMI 49) presented to [facility] ED with complaints of hyperglycemia.  Patient states he was previously on insulin which was stopped by his PCP a month or two ago as his blood sugars has improved.  He was instead started on Metformin.  Does admit to eating a lot of candy and drinking sweet tea.  States for about a week he has not been feeling well.  Reports fatigue, polyuria, and polydipsia.  Also states that previously he was on a blood pressure medication which was also stopped by his PCP during his previous appointment.  He was also on a cholesterol medicine which he was not able to pick up from the pharmacy.  Denies fevers, chills, cough, shortness of breath, chest pain, nausea, vomiting, abdominal pain, or diarrhea.  ED Course: Afebrile.  Tachycardic.  Labs showing no leukocytosis.  Blood glucose 482.  Bicarb 21, anion gap 19.  UA with ketones.  Beta hydroxybutyric acid level elevated at 2.49.  VBG with pH 7.41.  Transaminases mildly elevated (AST 50, ALT 61).  SARS-CoV-2 PCR test negative. UA not suggestive of infection. Chest x-ray showing no active disease. Patient received 2 L IV fluid boluses.  He was started on insulin and IV fluid infusion per DKA protocol.  Received potassium supplementation.  Review of Systems:  All systems reviewed and apart from history of presenting illness, are negative.  Past Medical History:  Diagnosis Date  . Diabetes mellitus without complication ([place])   . PNA (pneumonia)     History reviewed. No pertinent surgical history.   reports that he has quit smoking. His smoking use included cigars. He has  never used smokeless tobacco. He reports previous alcohol use. He reports that he does not use drugs.  No Known Allergies  Family History  Problem Relation Age of Onset  . Diabetes Mellitus II Mother   . Diabetes Mellitus II Sister     Prior to Admission medications   Medication Sig Start Date End Date Taking? Authorizing Provider  aspirin EC 81 MG EC tablet Take 1 tablet (81 mg total) by mouth daily. 05/03/19   Lavina Hamman, MD  atorvastatin (LIPITOR) 20 MG tablet Take 1 tablet (20 mg total) by mouth daily at 6 PM. 05/02/19 05/01/20  Lavina Hamman, MD  blood glucose meter kit and supplies Dispense based on patient and insurance preference. Use up to four times daily as directed. (FOR ICD-10 E10.9, E11.9). 05/02/19   Lavina Hamman, MD  Insulin Isophane & Regular Human (NOVOLIN 70/30 FLEXPEN RELION) (70-30) 100 UNIT/ML PEN Inject 23 Units into the skin 2 (two) times daily before a meal. 05/02/19   Lavina Hamman, MD  Insulin Pen Needle 31G X 5 MM MISC 1 Act by Does not apply route 2 (two) times daily before a meal. 05/02/19   Lavina Hamman, MD  metoprolol tartrate (LOPRESSOR) 50 MG tablet Take 1 tablet (50 mg total) by mouth 2 (two) times daily. 05/02/19   Lavina Hamman, MD  polyethylene glycol (MIRALAX / GLYCOLAX) 17 g packet Take 17 g by mouth daily as needed. 05/02/19   Lavina Hamman, MD  potassium chloride 20 MEQ TBCR Take 20 mEq  by mouth daily. 05/02/19   Lavina Hamman, MD    Physical Exam: Vitals:   10/24/19 2246 10/24/19 2330 10/25/19 0100 10/25/19 0200  BP: (!) 132/100 133/90 (!) 166/126 (!) 165/113  Pulse: (!) 102 (!) 105 (!) 109 (!) 103  Resp: 18 14 14 14   Temp:  98.2 F (36.8 C) 98.4 F (36.9 C)   TempSrc:   Oral   SpO2: 98% 98% 97% 100%  Weight:   (!) 166.5 kg   Height:        Physical Exam  Constitutional: He is oriented to person, place, and time. He appears well-developed and well-nourished. No distress.  HENT:  Head: Normocephalic.  Eyes: Right eye  exhibits no discharge. Left eye exhibits no discharge.  Cardiovascular: Normal rate, regular rhythm and intact distal pulses.  Pulmonary/Chest: Effort normal and breath sounds normal. No respiratory distress. He has no wheezes. He has no rales.  Abdominal: Soft. Bowel sounds are normal. He exhibits no distension. There is no abdominal tenderness. There is no guarding.  Musculoskeletal:        General: No edema.     Cervical back: Neck supple.  Neurological: He is alert and oriented to person, place, and time.  Skin: Skin is warm and dry. He is not diaphoretic.     Labs on Admission: I have personally reviewed following labs and imaging studies  CBC: Recent Labs  Lab 10/24/19 1528 10/24/19 1530  WBC 5.6  --   NEUTROABS 3.3  --   HGB 14.7 16.0  HCT 45.3 47.0  MCV 81.0  --   PLT 209  --    Basic Metabolic Panel: Recent Labs  Lab 10/24/19 1528 10/24/19 1530 10/24/19 2245 10/25/19 0217  NA 135 138 140 142  K 4.2 4.7 3.8 3.6  CL 95*  --  104 105  CO2 21*  --  24 23  GLUCOSE 482*  --  206* 185*  BUN 17  --  13 15  CREATININE 1.12  --  0.96 0.98  CALCIUM 10.3  --  9.7 9.9   GFR: Estimated Creatinine Clearance: 182.9 mL/min (by C-G formula based on SCr of 0.98 mg/dL). Liver Function Tests: Recent Labs  Lab 10/24/19 1528  AST 50*  ALT 61*  ALKPHOS 57  BILITOT 1.2  PROT 9.6*  ALBUMIN 4.9   No results for input(s): LIPASE, AMYLASE in the last 168 hours. No results for input(s): AMMONIA in the last 168 hours. Coagulation Profile: No results for input(s): INR, PROTIME in the last 168 hours. Cardiac Enzymes: No results for input(s): CKTOTAL, CKMB, CKMBINDEX, TROPONINI in the last 168 hours. BNP (last 3 results) No results for input(s): PROBNP in the last 8760 hours. HbA1C: No results for input(s): HGBA1C in the last 72 hours. CBG: Recent Labs  Lab 10/24/19 2328 10/25/19 0049 10/25/19 0159 10/25/19 0256 10/25/19 0344  GLUCAP 176* 220* 195* 167* 154*   Lipid  Profile: No results for input(s): CHOL, HDL, LDLCALC, TRIG, CHOLHDL, LDLDIRECT in the last 72 hours. Thyroid Function Tests: No results for input(s): TSH, T4TOTAL, FREET4, T3FREE, THYROIDAB in the last 72 hours. Anemia Panel: No results for input(s): VITAMINB12, FOLATE, FERRITIN, TIBC, IRON, RETICCTPCT in the last 72 hours. Urine analysis:    Component Value Date/Time   COLORURINE YELLOW 10/24/2019 1529   APPEARANCEUR CLEAR 10/24/2019 1529   LABSPEC 1.025 10/24/2019 1529   PHURINE 5.5 10/24/2019 1529   GLUCOSEU >=500 (A) 10/24/2019 1529   HGBUR SMALL (A) 10/24/2019 1529  BILIRUBINUR NEGATIVE 10/24/2019 1529   KETONESUR 40 (A) 10/24/2019 1529   PROTEINUR 100 (A) 10/24/2019 1529   NITRITE NEGATIVE 10/24/2019 1529   LEUKOCYTESUR NEGATIVE 10/24/2019 1529    Radiological Exams on Admission: DG Chest Port 1 View  Result Date: 10/24/2019 CLINICAL DATA:  Tachycardia. EXAM: PORTABLE CHEST 1 VIEW COMPARISON:  April 30, 2019 FINDINGS: The heart size and mediastinal contours are within normal limits. Both lungs are clear. The visualized skeletal structures are unremarkable. IMPRESSION: No active disease. Electronically Signed   By: Virgina Norfolk M.D.   On: 10/24/2019 17:46    Assessment/Plan Principal Problem:   DKA (diabetic ketoacidoses) ([place]) Active Problems:   New onset type 2 diabetes mellitus ([place])   Transaminitis   HTN (hypertension)   HLD (hyperlipidemia)   Mild DKA/uncontrolled insulin-dependent type 2 diabetes: Suspect related to dietary indiscretions in addition to his insulin being stopped. Currently only on Metformin.  Last A1c in September 2020 was 11.5.  Labs done on arrival showing blood glucose 482, bicarb 21, anion gap 19.  UA with ketones.  Beta hydroxybutyric acid level elevated at 2.49.  VBG with pH 7.41. Patient received 2 L IV fluid boluses.  He was started on insulin and IV fluid infusion per DKA protocol.  -DKA has now resolved. Bicarb normal x2 and gap  closed x2 on repeat labs.  Repeat beta hydroxybutyric acid level normal.  Most recent blood glucose 167. Will initiate diet and Lantus 25 units.  Continue IV insulin for an additional 2 hours.  Then, sliding scale insulin moderate ACHS.  Check A1c.  Consult diabetes coordinator.  Mild transaminitis: Possibly due to stress from DKA versus fatty liver.  Repeat LFTs in a.m.  If still elevated, order right upper quadrant ultrasound.  Hypertension: He was previously on metoprolol which he states was stopped by his physician.  Blood pressure currently elevated.  Will resume metoprolol and order hydralazine as needed.  Hyperlipidemia: Statin listed in home medications but patient reports nonadherence.  LDL 148 on last lipid panel done in September 2020.  Resume statin therapy.  DVT prophylaxis: Lovenox Code Status: Full code Family Communication: No family available at this time. Disposition Plan: Anticipate discharge after clinical improvement. Consults called: None  The medical decision making on this patient was of high complexity and the patient is at high risk for clinical deterioration, therefore this is a level 3 visit.  Shela Leff MD [facility]  If 7PM-7AM, please contact night-coverage [email or web address]  10/25/2019, 3:52 AM

## 2019-10-26 ENCOUNTER — Encounter (HOSPITAL_COMMUNITY): Payer: Self-pay

## 2019-10-26 LAB — COMPREHENSIVE METABOLIC PANEL
ALT: 58 U/L — ABNORMAL HIGH (ref 0–44)
AST: 60 U/L — ABNORMAL HIGH (ref 15–41)
Albumin: 3.9 g/dL (ref 3.5–5.0)
Alkaline Phosphatase: 48 U/L (ref 38–126)
Anion gap: 12 (ref 5–15)
BUN: 13 mg/dL (ref 6–20)
CO2: 23 mmol/L (ref 22–32)
Calcium: 9.1 mg/dL (ref 8.9–10.3)
Chloride: 100 mmol/L (ref 98–111)
Creatinine, Ser: 0.88 mg/dL (ref 0.61–1.24)
GFR calc Af Amer: >60 mL/min (ref >60)
GFR calc non Af Amer: >60 mL/min (ref >60)
Glucose, Bld: 309 mg/dL — ABNORMAL HIGH (ref 70–99)
Potassium: 3.9 mmol/L (ref 3.5–5.1)
Sodium: 135 mmol/L (ref 135–145)
Total Bilirubin: 0.9 mg/dL (ref 0.3–1.2)
Total Protein: 7.7 g/dL (ref 6.5–8.1)

## 2019-10-26 LAB — CBC
HCT: 41.6 % (ref 39.0–52.0)
Hemoglobin: 13.1 g/dL (ref 13.0–17.0)
MCH: 26.5 pg (ref 26.0–34.0)
MCHC: 31.5 g/dL (ref 30.0–36.0)
MCV: 84.2 fL (ref 80.0–100.0)
Platelets: 192 10*3/uL (ref 150–400)
RBC: 4.94 MIL/uL (ref 4.22–5.81)
RDW: 14.7 % (ref 11.5–15.5)
WBC: 5.6 10*3/uL (ref 4.0–10.5)
nRBC: 0 % (ref 0.0–0.2)

## 2019-10-26 LAB — URINE CULTURE

## 2019-10-26 LAB — GLUCOSE, CAPILLARY: Glucose-Capillary: 269 mg/dL — ABNORMAL HIGH (ref 70–99)

## 2019-10-26 LAB — MAGNESIUM: Magnesium: 1.8 mg/dL (ref 1.7–2.4)

## 2019-10-26 MED ORDER — NOVOLIN 70/30 FLEXPEN RELION (70-30) 100 UNIT/ML ~~LOC~~ SUPN: 23.0000 [IU] | PEN_INJECTOR | Freq: Two times a day (BID) | SUBCUTANEOUS | 6 refills | Status: DC

## 2019-10-26 MED ORDER — INSULIN PEN NEEDLE 31G X 5 MM MISC: 1.0000 | Freq: Two times a day (BID) | 6 refills | Status: AC

## 2019-10-26 MED ORDER — LISINOPRIL 5 MG PO TABS: 5.0000 mg | ORAL_TABLET | Freq: Every day | ORAL | 6 refills | Status: AC

## 2019-10-26 MED ORDER — NOVOLIN 70/30 FLEXPEN RELION (70-30) 100 UNIT/ML ~~LOC~~ SUPN: 30.0000 [IU] | PEN_INJECTOR | Freq: Two times a day (BID) | SUBCUTANEOUS | 6 refills | Status: AC

## 2019-10-26 MED ORDER — ATORVASTATIN CALCIUM 20 MG PO TABS: 20.0000 mg | ORAL_TABLET | Freq: Every day | ORAL | 2 refills | Status: AC

## 2019-10-26 MED ORDER — INSULIN ASPART PROT & ASPART (70-30 MIX) 100 UNIT/ML ~~LOC~~ SUSP
30.0000 [IU] | Freq: Two times a day (BID) | SUBCUTANEOUS | Status: DC
  Filled 2019-10-26: qty 10

## 2019-10-26 MED ORDER — LISINOPRIL 5 MG PO TABS
5.0000 mg | ORAL_TABLET | Freq: Every day | ORAL | Status: DC
  Administered 2019-10-26: 5 mg via ORAL
  Filled 2019-10-26: qty 1

## 2019-10-26 NOTE — Progress Notes (Signed)
Assessment unchanged. Pt verbalized understanding of dc instructions through teach back. Pt has been on the insulin kwikpen before, verbalized understanding on how to use. Discharged via wc to front entrance accompanied by NT.

## 2019-10-26 NOTE — Discharge Summary (Signed)
Physician Discharge Summary  Pilar Plate. WLN:[id number] DOB: [birth year] DOA: 10/24/2019  PCP: System, Provider Not In  Admit date: 10/24/2019 Discharge date: 10/26/2019  Admitted From: Home  disposition: Home Recommendations for Outpatient Follow-up:  1. Follow up with PCP in 1-2 weeks 2. Please obtain BMP/CBC in one week  Home Health none Equipment/Devices: None Discharge Condition stable and improved CODE STATUS full code  diet recommendation: Carb modified/cardiac Brief/Interim Summary:29 y.o.malewith medical history significant ofinsulin-dependent diabetes, hypertension, hyperlipidemia, morbid obesity (BMI 49) presented to [facility] ED with complaints of hyperglycemia.Patient states he was previously on insulin which was stopped by his PCP a month or twoago as his blood sugars has improved.He was instead started on Metformin.Does admit to eating a lot of candy and drinking sweet tea. States for about a week he has not been feeling well. Reports fatigue, polyuria, and polydipsia.Also states that previously he was on a blood pressure medication which was also stopped by his PCPduring his previous appointment. He was also on a cholesterol medicine which he was not able to pick up from thepharmacy. Denies fevers, chills, cough, shortness of breath, chest pain, nausea, vomiting, abdominal pain, or diarrhea.  ED Course:Afebrile. Tachycardic. Labs showing no leukocytosis. Blood glucose 482. Bicarb 21, anion gap 19. UA with ketones. Beta hydroxybutyric acid level elevated at 2.49. VBG with pH 7.41. Transaminases mildly elevated (AST 50, ALT 61). SARS-CoV-2 PCR test negative. UA not suggestive of infection. Chest x-ray showing no active disease. Patient received 2 L IV fluid boluses. He was started on insulin and IV fluid infusion per DKA protocol. Received potassium supplementation.  Discharge Diagnoses:  Principal Problem:   DKA (diabetic  ketoacidoses) ([place]) Active Problems:   New onset type 2 diabetes mellitus ([place])   Transaminitis   HTN (hypertension)   HLD (hyperlipidemia)   #1 DKA/uncontrolled type 2 diabetes-patient reports he was taking all his medications as prescribed.  He was on insulin before and that was stopped by his doctor few weeks ago and was started on Metformin for unclear reasons.  His hemoglobin A1c is 11.4.  On admission his gap was 19 which is closed blood glucose was 482 down to high 100s to low 200s now.  He was treated with insulin and IV fluids. DKA had resolved. He will be discharged home on Novolin 70/30 30 units twice a day.    #2 hypertension on metoprolol and ACE inhibitor Zestril 5 mg daily.  #3 hyperlipidemia continue statin his last LDL was 148.  #4 transaminitis resolved. Estimated body mass index is 49.78 kg/m as calculated from the following:   Height as of this encounter: 6' (1.829 m).   Weight as of this encounter: 166.5 kg.  Discharge Instructions  Discharge Instructions    Diet - low sodium heart healthy   Complete by: As directed    Increase activity slowly   Complete by: As directed      Allergies as of 10/26/2019   No Known Allergies     Medication List    STOP taking these medications   METFORMIN HCL PO   Potassium Chloride ER 20 MEQ Tbcr     TAKE these medications   aspirin 81 MG EC tablet Take 1 tablet (81 mg total) by mouth daily.   atorvastatin 20 MG tablet Commonly known as: Lipitor Take 1 tablet (20 mg total) by mouth daily at 6 PM.   blood glucose meter kit and supplies Dispense based on patient and insurance preference. Use up to  four times daily as directed. (FOR ICD-10 E10.9, E11.9).   Insulin Pen Needle 31G X 5 MM Misc 1 Act by Does not apply route 2 (two) times daily before a meal.   lisinopril 5 MG tablet Commonly known as: ZESTRIL Take 1 tablet (5 mg total) by mouth daily.   metoprolol tartrate 50 MG tablet Commonly known as:  LOPRESSOR Take 1 tablet (50 mg total) by mouth 2 (two) times daily.   NovoLIN 70/30 FlexPen Relion (70-30) 100 UNIT/ML KwikPen Generic drug: insulin isophane & regular human Inject 23 Units into the skin 2 (two) times daily before a meal.   polyethylene glycol 17 g packet Commonly known as: MIRALAX / GLYCOLAX Take 17 g by mouth daily as needed. What changed: reasons to take this      [facility] Follow up.   Contact information: [place] [phone]         No Known Allergies  Consultations: none  Procedures/Studies: DG Chest Port 1 View  Result Date: 10/24/2019 CLINICAL DATA:  Tachycardia. EXAM: PORTABLE CHEST 1 VIEW COMPARISON:  April 30, 2019 FINDINGS: The heart size and mediastinal contours are within normal limits. Both lungs are clear. The visualized skeletal structures are unremarkable. IMPRESSION: No active disease. Electronically Signed   By: Virgina Norfolk M.D.   On: 10/24/2019 17:46    (Echo, Carotid, EGD, Colonoscopy, ERCP)    Subjective: Patient resting in bed awake alert ordering breakfast anxious to go home denies any complaints of nausea vomiting He is willing to go home on insulin he does not know how to take insulin  Discharge Exam: Vitals:   10/25/19 2209 10/26/19 0645  BP: (!) 158/100 136/89  Pulse: 94 96  Resp: 16 16  Temp: 97.8 F (36.6 C) 98.1 F (36.7 C)  SpO2: 95% 98%   Vitals:   10/25/19 1239 10/25/19 1600 10/25/19 2209 10/26/19 0645  BP: 134/86 132/74 (!) 158/100 136/89  Pulse: 92 91 94 96  Resp: 18  16 16   Temp: 98.2 F (36.8 C)  97.8 F (36.6 C) 98.1 F (36.7 C)  TempSrc: Oral  Oral Oral  SpO2: 97% 96% 95% 98%  Weight:      Height:        General: Pt is alert, awake, not in acute distress Cardiovascular: RRR, S1/S2 +, no rubs, no gallops Respiratory: CTA bilaterally, no wheezing, no rhonchi Abdominal: Soft,  NT, ND, bowel sounds + Extremities: no edema, no cyanosis    The results of significant diagnostics from this hospitalization (including imaging, microbiology, ancillary and laboratory) are listed below for reference.     Microbiology: Recent Results (from the past 240 hour(s))  SARS CORONAVIRUS 2 (TAT 6-24 HRS) Nasopharyngeal Nasopharyngeal Swab     Status: None   Collection Time: 10/24/19  3:29 PM   Specimen: Nasopharyngeal Swab  Result Value Ref Range Status   SARS Coronavirus 2 NEGATIVE NEGATIVE Final    Comment: (NOTE) SARS-CoV-2 target nucleic acids are NOT DETECTED. The SARS-CoV-2 RNA is generally detectable in upper and lower respiratory specimens during the acute phase of infection. Negative results do not preclude SARS-CoV-2 infection, do not rule out co-infections with other pathogens, and should not be used as the sole basis for treatment or other patient management decisions. Negative results must be combined with clinical observations, patient history, and epidemiological information. The expected result is Negative. Fact Sheet for Patients: SugarRoll.be Fact Sheet for  Healthcare Providers: https://www.woods-[place].com/ This test is not yet approved or cleared by the [place] and  has been authorized for detection and/or diagnosis of SARS-CoV-2 by FDA under an Emergency Use Authorization (EUA). This EUA will remain  in effect (meaning this test can be used) for the duration of the COVID-19 declaration under Section 56 4(b)(1) of the Act, 21 U.S.C. section 360bbb-3(b)(1), unless the authorization is terminated or revoked sooner. Performed at [facility], [place] [address] [place], [place] [address]   MRSA PCR Screening     Status: None   Collection Time: 10/25/19 12:51 AM   Specimen: Nasal Mucosa; Nasopharyngeal  Result Value Ref Range Status   MRSA by PCR NEGATIVE NEGATIVE Final    Comment:        The  GeneXpert MRSA Assay (FDA approved for NASAL specimens only), is one component of a comprehensive MRSA colonization surveillance program. It is not intended to diagnose MRSA infection nor to guide or monitor treatment for MRSA infections. Performed at [facility], [place] [address] [place], [place] [address]      Labs: BNP (last 3 results) No results for input(s): BNP in the last 8760 hours. Basic Metabolic Panel: Recent Labs  Lab 10/24/19 1528 10/24/19 1530 10/24/19 2245 10/25/19 0217 10/26/19 0450  NA 135 138 140 142 135  K 4.2 4.7 3.8 3.6 3.9  CL 95*  --  104 105 100  CO2 21*  --  24 23 23   GLUCOSE 482*  --  206* 185* 309*  BUN 17  --  13 15 13   CREATININE 1.12  --  0.96 0.98 0.88  CALCIUM 10.3  --  9.7 9.9 9.1  MG  --   --   --   --  1.8   Liver Function Tests: Recent Labs  Lab 10/24/19 1528 10/25/19 0647 10/26/19 0450  AST 50* 58* 60*  ALT 61* 56* 58*  ALKPHOS 57 49 48  BILITOT 1.2 0.9 0.9  PROT 9.6* 8.3* 7.7  ALBUMIN 4.9 4.1 3.9   No results for input(s): LIPASE, AMYLASE in the last 168 hours. No results for input(s): AMMONIA in the last 168 hours. CBC: Recent Labs  Lab 10/24/19 1528 10/24/19 1530 10/26/19 0450  WBC 5.6  --  5.6  NEUTROABS 3.3  --   --   HGB 14.7 16.0 13.1  HCT 45.3 47.0 41.6  MCV 81.0  --  84.2  PLT 209  --  192   Cardiac Enzymes: No results for input(s): CKTOTAL, CKMB, CKMBINDEX, TROPONINI in the last 168 hours. BNP: Invalid input(s): POCBNP CBG: Recent Labs  Lab 10/25/19 0735 10/25/19 1157 10/25/19 1630 10/25/19 2209 10/26/19 0723  GLUCAP 188* 314* 348* 303* 269*   D-Dimer No results for input(s): DDIMER in the last 72 hours. Hgb A1c Recent Labs    10/25/19 0647  HGBA1C 11.1*   Lipid Profile No results for input(s): CHOL, HDL, LDLCALC, TRIG, CHOLHDL, LDLDIRECT in the last 72 hours. Thyroid function studies No results for input(s): TSH, T4TOTAL, T3FREE, THYROIDAB in the last 72  hours.  Invalid input(s): FREET3 Anemia work up No results for input(s): VITAMINB12, FOLATE, FERRITIN, TIBC, IRON, RETICCTPCT in the last 72 hours. Urinalysis    Component Value Date/Time   COLORURINE YELLOW 10/24/2019 1529   APPEARANCEUR CLEAR 10/24/2019 1529   LABSPEC 1.025 10/24/2019 1529   PHURINE 5.5 10/24/2019 1529   GLUCOSEU >=500 (A) 10/24/2019 1529   HGBUR SMALL (A) 10/24/2019 1529   BILIRUBINUR  NEGATIVE 10/24/2019 1529   KETONESUR 40 (A) 10/24/2019 1529   PROTEINUR 100 (A) 10/24/2019 1529   NITRITE NEGATIVE 10/24/2019 1529   LEUKOCYTESUR NEGATIVE 10/24/2019 1529   Sepsis Labs Invalid input(s): PROCALCITONIN,  WBC,  LACTICIDVEN Microbiology Recent Results (from the past 240 hour(s))  SARS CORONAVIRUS 2 (TAT 6-24 HRS) Nasopharyngeal Nasopharyngeal Swab     Status: None   Collection Time: 10/24/19  3:29 PM   Specimen: Nasopharyngeal Swab  Result Value Ref Range Status   SARS Coronavirus 2 NEGATIVE NEGATIVE Final    Comment: (NOTE) SARS-CoV-2 target nucleic acids are NOT DETECTED. The SARS-CoV-2 RNA is generally detectable in upper and lower respiratory specimens during the acute phase of infection. Negative results do not preclude SARS-CoV-2 infection, do not rule out co-infections with other pathogens, and should not be used as the sole basis for treatment or other patient management decisions. Negative results must be combined with clinical observations, patient history, and epidemiological information. The expected result is Negative. Fact Sheet for Patients: SugarRoll.be Fact Sheet for Healthcare Providers: https://www.woods-[employer] [place].com/ This test is not yet approved or cleared by the [place] FDA and  has been authorized for detection and/or diagnosis of SARS-CoV-2 by FDA under an Emergency Use Authorization (EUA). This EUA will remain  in effect (meaning this test can be used) for the duration of the COVID-19  declaration under Section 56 4(b)(1) of the Act, 21 U.S.C. section 360bbb-3(b)(1), unless the authorization is terminated or revoked sooner. Performed at [facility], [place] [address] [place], [place] [address]   MRSA PCR Screening     Status: None   Collection Time: 10/25/19 12:51 AM   Specimen: Nasal Mucosa; Nasopharyngeal  Result Value Ref Range Status   MRSA by PCR NEGATIVE NEGATIVE Final    Comment:        The GeneXpert MRSA Assay (FDA approved for NASAL specimens only), is one component of a comprehensive MRSA colonization surveillance program. It is not intended to diagnose MRSA infection nor to guide or monitor treatment for MRSA infections. Performed at [facility], [place] [address], [place], [place] [address]      Time coordinating discharge:  39 minutes  SIGNED:   Georgette Shell, MD  [facility] 10/26/2019, 8:22 AM Pager   If 7PM-7AM, please contact night-coverage [email or web address] Password [email or web address]

## 2019-10-26 NOTE — TOC Initial Note (Signed)
Transition of Care (TOC) - Initial/Assessment Note    Patient Details  Name: George Sanders. MRN: [id number] Date of Birth: [birth year]  Transition of Care (TOC) CM/SW Contact:    Joaquin Courts, RN Phone Number: 10/26/2019, 9:23 AM  Clinical Narrative:        CM spoke with patient regarding medication affordability.  Patient reports his pcp has recently changed his medications but they did not work and he will now need insulin again.  CM discussed the cost of patient's medications to day and patient reported that he is able to afford the cost of his dc medications including insulin. CM also discussed the [place] [facility] wellness [facility] and financial assistance.  CM also spoke with patient regarding the healthcare marketplace being open for enrollment and this may be an option for patient to obtain affordable healthcare coverage.  [facility] information was placed on AVS in case patient wishes to persue financial assistance there. No further needs identified.            Expected Discharge Plan: Home/Self Care Barriers to Discharge: No Barriers Identified   Patient Goals and CMS Choice Patient states their goals for this hospitalization and ongoing recovery are:: to go home      Expected Discharge Plan and Services Expected Discharge Plan: Home/Self Care   Discharge Planning Services: CM Consult   Living arrangements for the past 2 months: Single Family Home Expected Discharge Date: 10/26/19               DME Arranged: N/A DME Agency: NA       HH Arranged: NA [place] Agency: NA        Prior Living Arrangements/Services Living arrangements for the past 2 months: Single Family Home   Patient language and need for interpreter reviewed:: Yes Do you feel safe going back to the place where you live?: Yes      Need for Family Participation in Patient Care: Yes (Comment) Care giver support system in place?: Yes (comment)   Criminal Activity/Legal Involvement Pertinent to Current  Situation/Hospitalization: No - Comment as needed  Activities of Daily Living Home Assistive Devices/Equipment: None ADL Screening (condition at time of admission) Patient's cognitive ability adequate to safely complete daily activities?: Yes Is the patient deaf or have difficulty hearing?: No Does the patient have difficulty seeing, even when wearing glasses/contacts?: No Does the patient have difficulty concentrating, remembering, or making decisions?: No Patient able to express need for assistance with ADLs?: Yes Does the patient have difficulty dressing or bathing?: No Independently performs ADLs?: Yes (appropriate for developmental age) Does the patient have difficulty walking or climbing stairs?: No Weakness of Legs: None Weakness of Arms/Hands: None  Permission Sought/Granted                  Emotional Assessment   Attitude/Demeanor/Rapport: Engaged Affect (typically observed): Accepting Orientation: : Oriented to Self, Oriented to Place, Oriented to  Time, Oriented to Situation   Psych Involvement: No (comment)  Admission diagnosis:  Hyperglycemia [R73.9] Diabetic ketoacidosis without coma associated with type 2 diabetes mellitus ([place]) [E11.10] Patient Active Problem List   Diagnosis Date Noted  . Transaminitis 10/25/2019  . HTN (hypertension) 10/25/2019  . HLD (hyperlipidemia) 10/25/2019  . Chest pain 04/30/2019  . Elevated blood pressure reading 04/30/2019  . DKA (diabetic ketoacidoses) ([place]) 04/29/2019  . New onset type 2 diabetes mellitus ([place]) 04/29/2019  . Abscess of scrotum 04/29/2019  . Abnormal LFTs 04/29/2019  . Abdominal pain 04/29/2019  PCP:  System, Provider Not In Pharmacy:   [facility] [address] [place], [place] - [address] [place] [address] Phone: [phone] Fax: [phone]     Social Determinants of Health (SDOH) Interventions    Readmission Risk Interventions No flowsheet data found.

## 2021-05-28 IMAGING — CT CT ABD-PELV W/ CM
2 of 4 series · 16 of 46 positions shown, 18 images · IV contrast (OMNIPAQUE)
Comparison: None.

CLINICAL DATA: Constipation, open wound of the scrotum. Epigastric
pain.

EXAM:
CT ABDOMEN AND PELVIS WITH CONTRAST
TECHNIQUE: Multidetector CT imaging of the abdomen and pelvis was performed
using the standard protocol following bolus administration of
intravenous contrast.
CONTRAST:  100mL OMNIPAQUE IOHEXOL 300 MG/ML SOLN, 30mL OMNIPAQUE
IOHEXOL 300 MG/ML SOLN

[Series 2: axial st · axial · 0.91mm/px · z∈[-610,-140]mm · 13 of 108 slices shown, 15 images]
[im 7/108  soft-tissue]
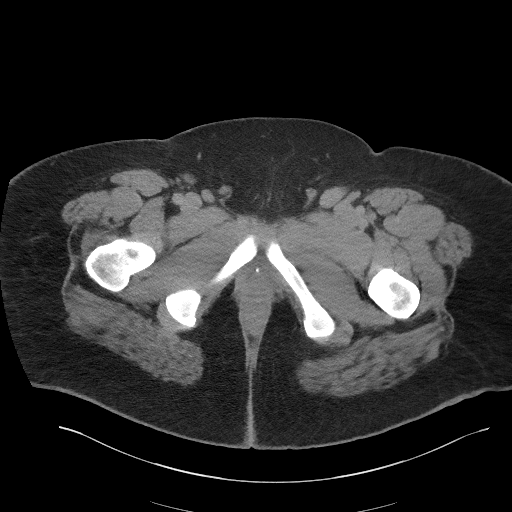
[im 7/108  bone]
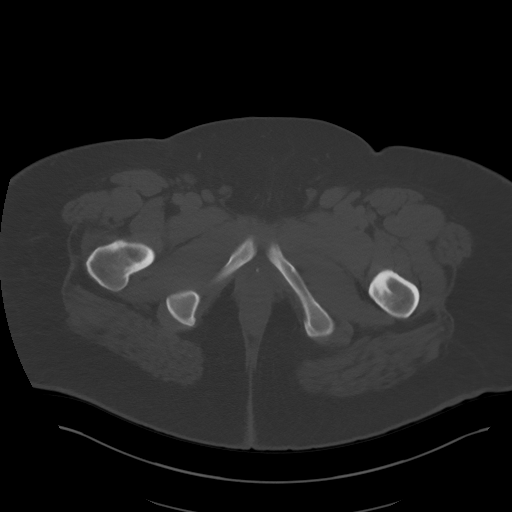
[im 14/108  soft-tissue]
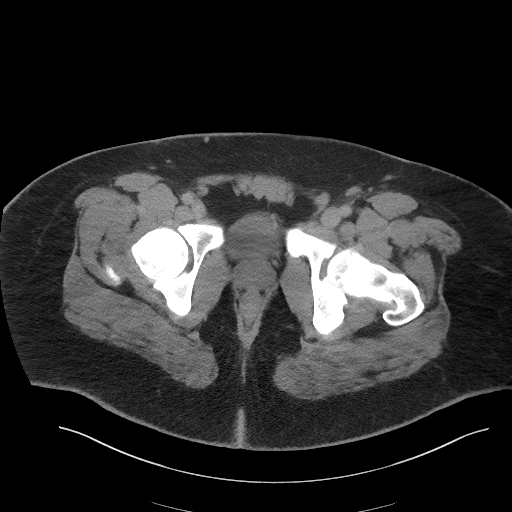
[im 21/108  soft-tissue]
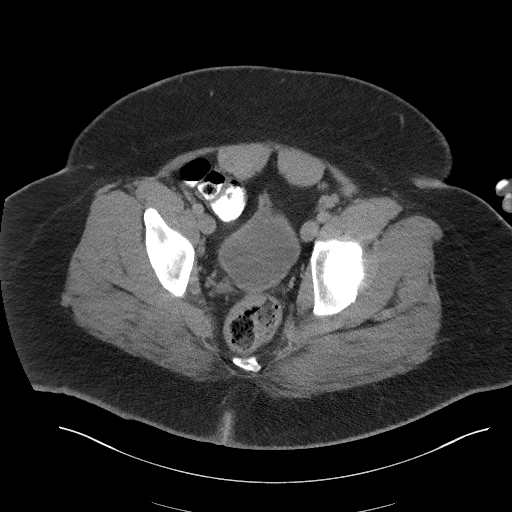
[im 34/108  soft-tissue]
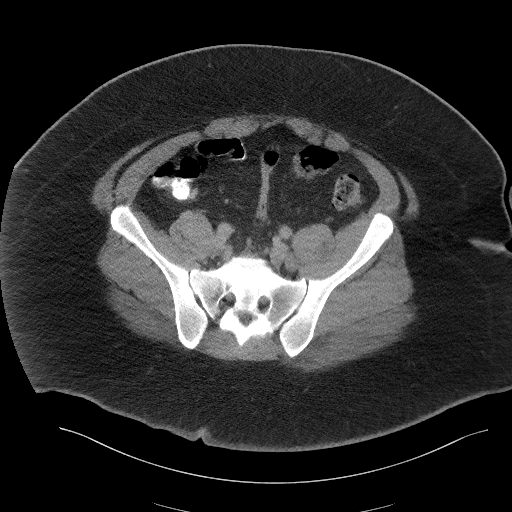
[im 41/108  soft-tissue]
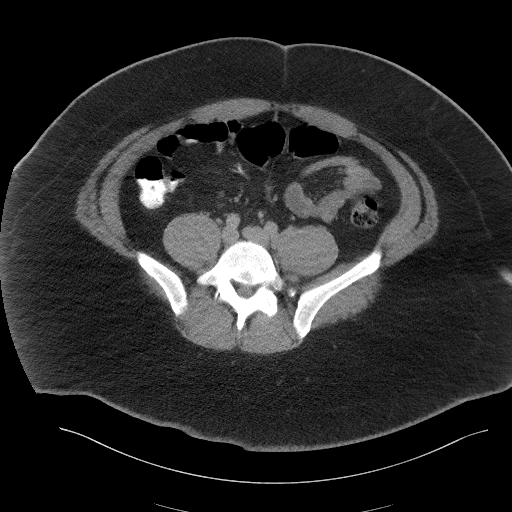
[im 47/108  soft-tissue]
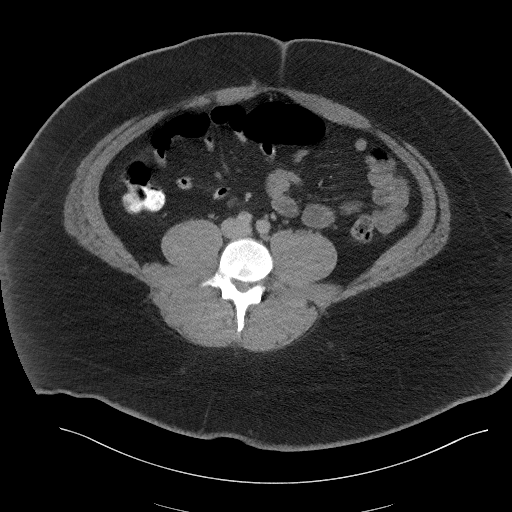
[im 54/108  soft-tissue]
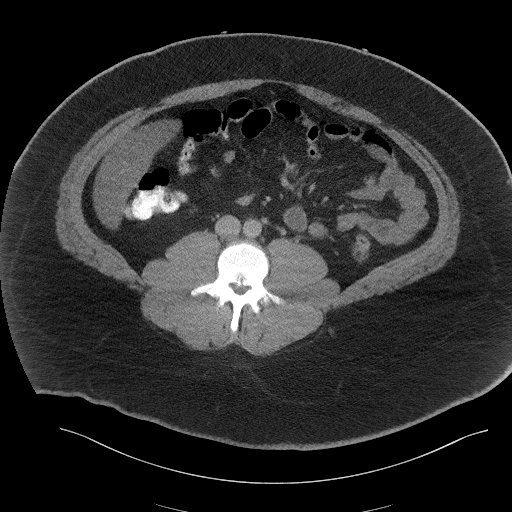
[im 61/108  soft-tissue]
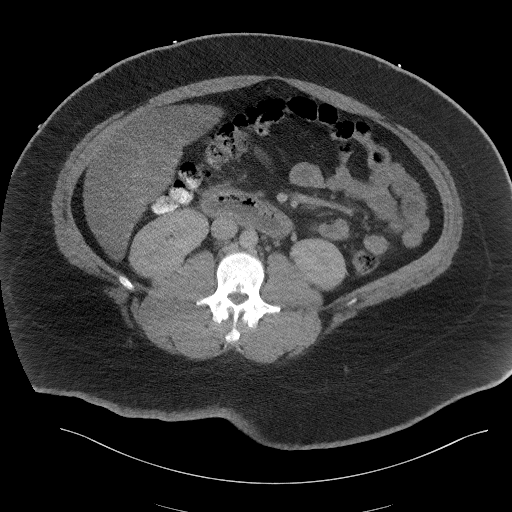
[im 67/108  soft-tissue]
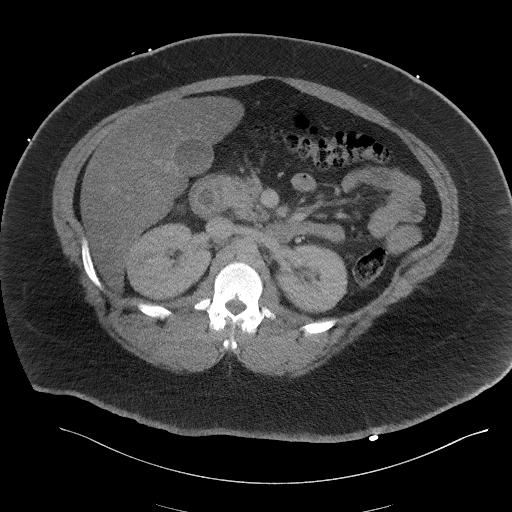
[im 67/108  bone]
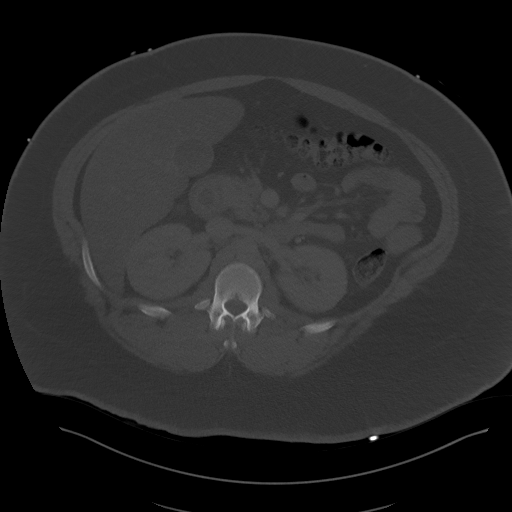
[im 74/108  soft-tissue]
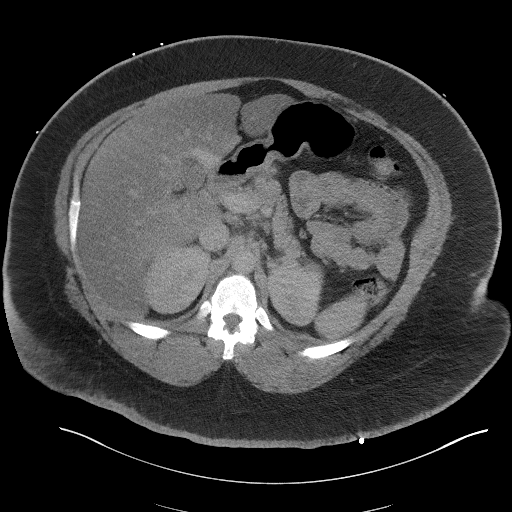
[im 87/108  soft-tissue]
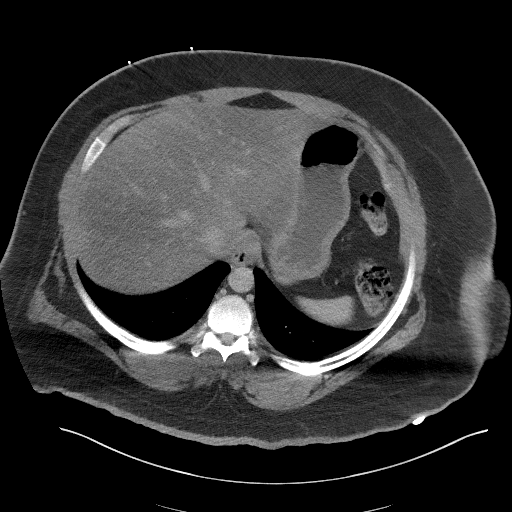
[im 94/108  soft-tissue]
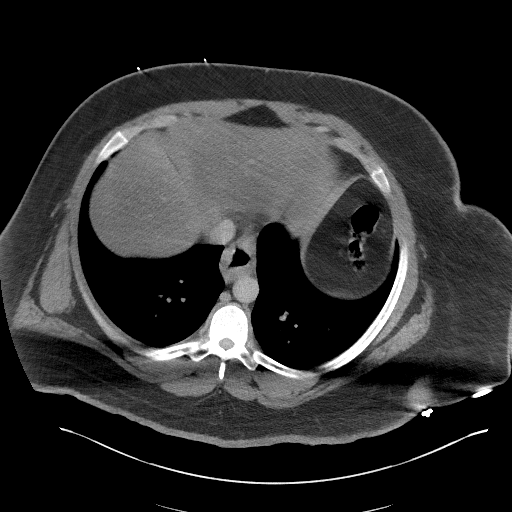
[im 101/108  soft-tissue]
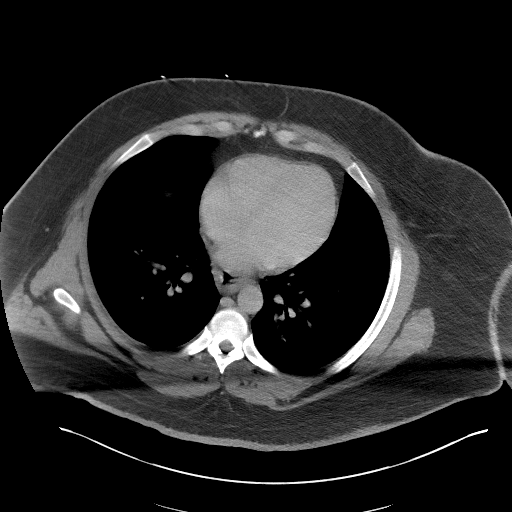

[Series 4: coronal st · coronal · 1.06mm/px · 3 of 101 slices shown]
[im 34/101  soft-tissue]
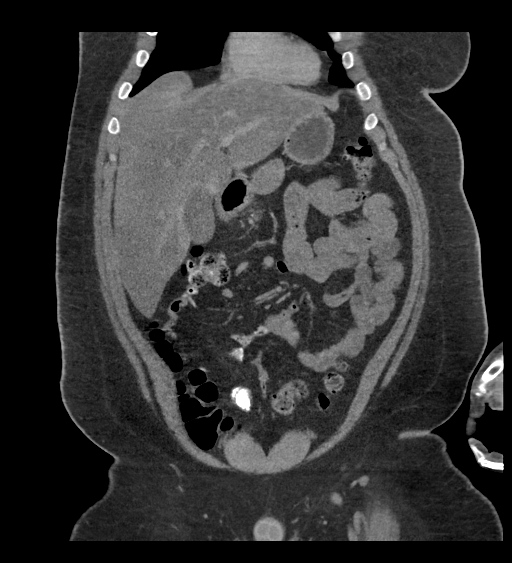
[im 45/101  soft-tissue]
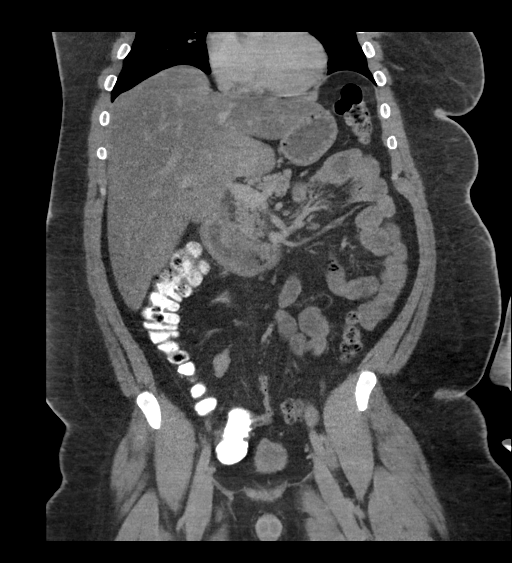
[im 56/101  soft-tissue]
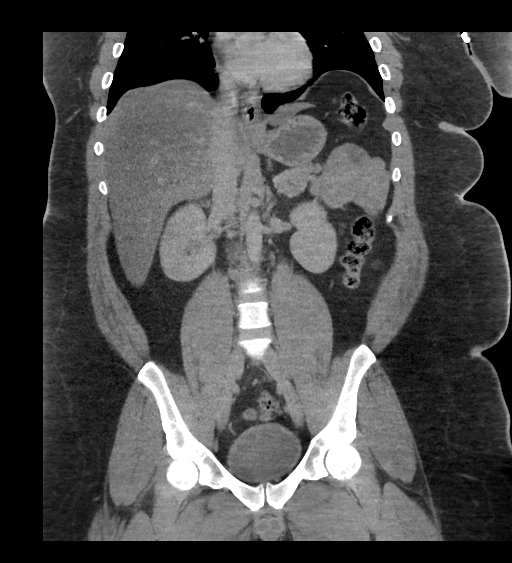

[16 of 46 positions shown; findings below may reference images not displayed]

FINDINGS: Lower chest: No acute abnormality.

Hepatobiliary: Diffuse low attenuation of the liver as can be seen
with hepatic steatosis. No focal hepatic mass. Normal gallbladder.

Pancreas: Choose

Spleen: Normal in size without focal abnormality.

Adrenals/Urinary Tract: Adrenal glands are unremarkable. Kidneys are
normal, without renal calculi, focal lesion, or hydronephrosis.
Bladder is unremarkable.

Stomach/Bowel: Small hiatal hernia. Stomach is otherwise normal.
Appendix appears normal. No evidence of bowel dilatation. Mild bowel
wall thickening involving the duodenum as can be seen with
duodenitis secondary to an infectious or inflammatory etiology.
Moderate amount of stool throughout the colon.

Vascular/Lymphatic: No significant vascular findings are present. 16
mm mesenteric lymph node in the right lower quadrant likely
reactive. No other enlarged lymph nodes.

Reproductive: Prostate is unremarkable.

Other: No abdominal wall hernia or abnormality. No abdominopelvic
ascites.

Musculoskeletal: No acute osseous abnormality. No aggressive osseous
lesion.
IMPRESSION: 1. Mild bowel wall thickening involving the duodenum as can be seen
with duodenitis secondary to an infectious or inflammatory etiology.
2. Hepatic steatosis.

## 2021-11-21 IMAGING — DX DG CHEST 1V PORT
1 series · 1 of 1 positions shown · non-contrast
Comparison: April 30, 2019

CLINICAL DATA: Tachycardia.

EXAM:
PORTABLE CHEST 1 VIEW

[chest ap]
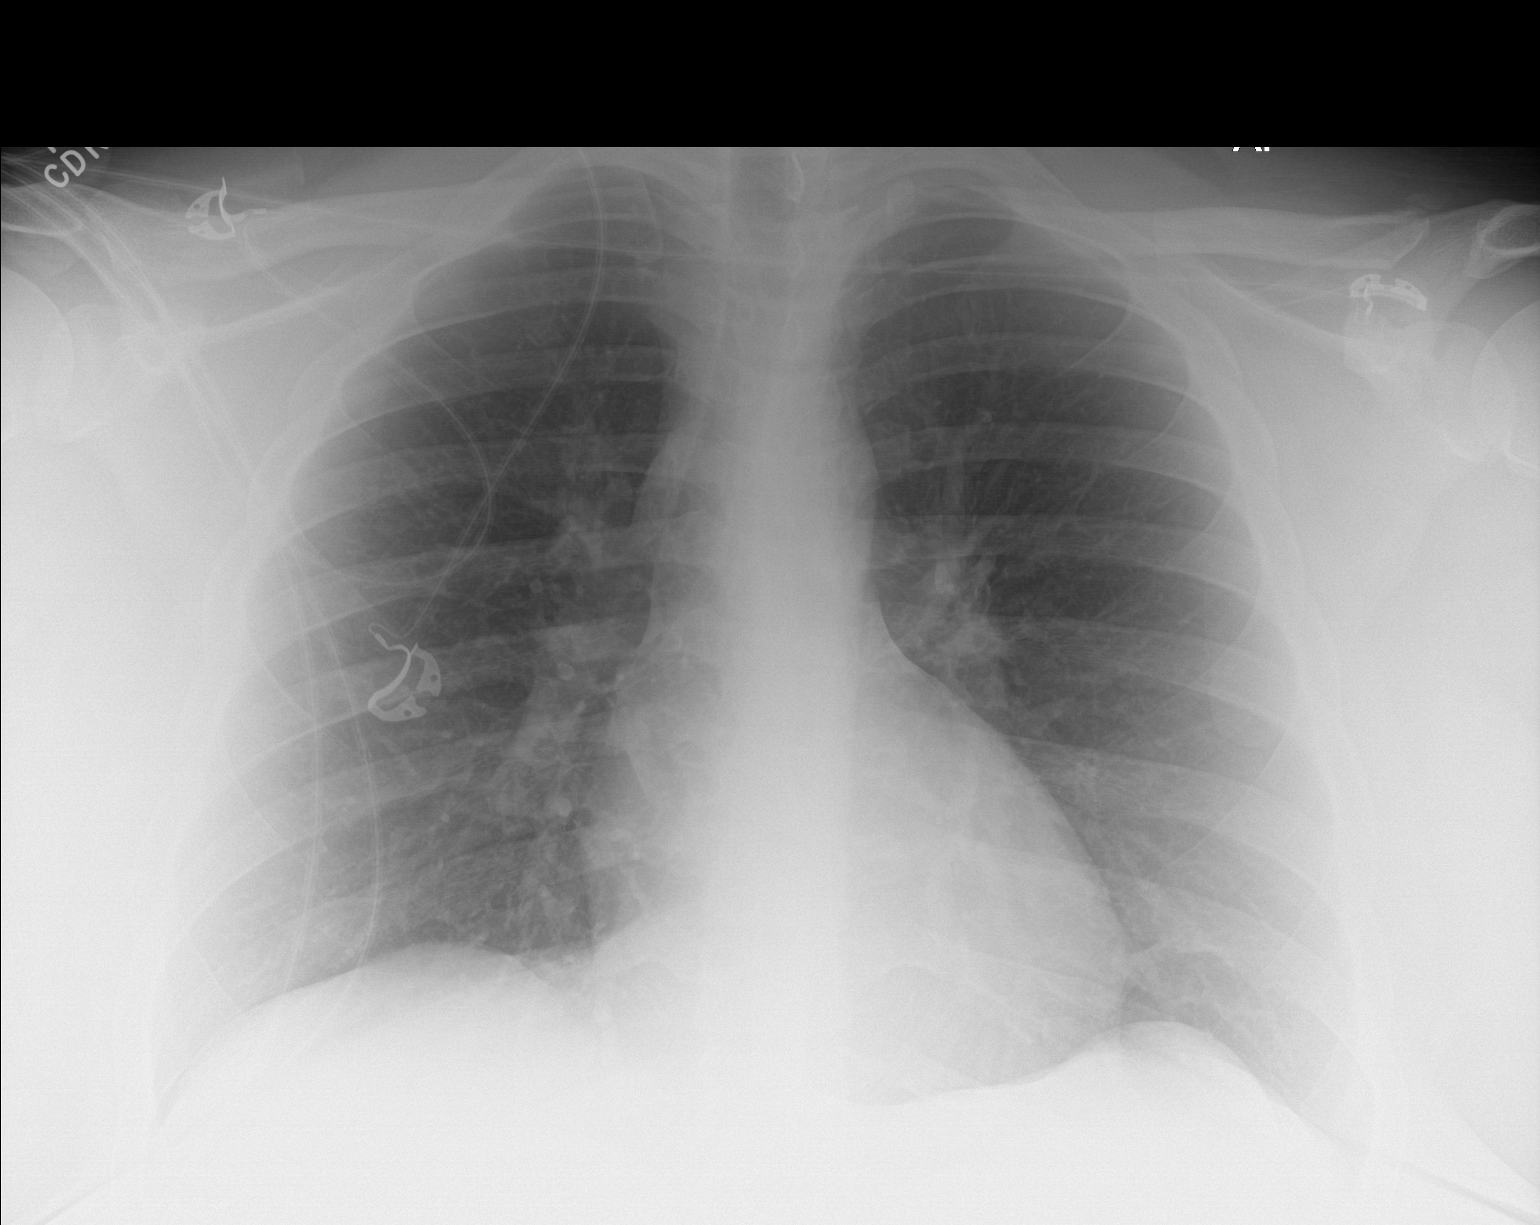

[1 of 1 positions shown; findings below may reference images not displayed]

FINDINGS: The heart size and mediastinal contours are within normal limits.
Both lungs are clear. The visualized skeletal structures are
unremarkable.
IMPRESSION: No active disease.

## 2022-03-27 ENCOUNTER — Emergency Department (HOSPITAL_BASED_OUTPATIENT_CLINIC_OR_DEPARTMENT_OTHER): Payer: Self-pay

## 2022-03-27 ENCOUNTER — Encounter (HOSPITAL_BASED_OUTPATIENT_CLINIC_OR_DEPARTMENT_OTHER): Payer: Self-pay | Admitting: Emergency Medicine

## 2022-03-27 ENCOUNTER — Other Ambulatory Visit: Payer: Self-pay

## 2022-03-27 DIAGNOSIS — Z87891 Personal history of nicotine dependence: Secondary | ICD-10-CM | POA: Insufficient documentation

## 2022-03-27 DIAGNOSIS — M5481 Occipital neuralgia: Secondary | ICD-10-CM | POA: Insufficient documentation

## 2022-03-27 DIAGNOSIS — Z794 Long term (current) use of insulin: Secondary | ICD-10-CM | POA: Insufficient documentation

## 2022-03-27 DIAGNOSIS — Z7982 Long term (current) use of aspirin: Secondary | ICD-10-CM | POA: Insufficient documentation

## 2022-03-27 DIAGNOSIS — E119 Type 2 diabetes mellitus without complications: Secondary | ICD-10-CM | POA: Insufficient documentation

## 2022-03-27 NOTE — ED Triage Notes (Addendum)
Sharp right sided head pain (above right ear) since yesterday. States pain is intermittent, and throbbing. States feels worse than a headache. Endorse changes in vision when pain occurs. Denies injury, n/v, congestion, fevers.

## 2022-03-28 ENCOUNTER — Other Ambulatory Visit: Payer: Self-pay

## 2022-03-28 ENCOUNTER — Emergency Department (HOSPITAL_BASED_OUTPATIENT_CLINIC_OR_DEPARTMENT_OTHER)
Admission: EM | Admit: 2022-03-28 | Discharge: 2022-03-28 | Disposition: A | Discharge status: Home / Self Care | Payer: Self-pay | Attending: Emergency Medicine | Admitting: Emergency Medicine

## 2022-03-28 DIAGNOSIS — M5481 Occipital neuralgia: Secondary | ICD-10-CM

## 2022-03-28 MED ORDER — CARBAMAZEPINE 200 MG PO TABS: ORAL_TABLET | ORAL | 1 refills | Status: AC

## 2022-03-28 MED ORDER — CARBAMAZEPINE 200 MG PO TABS
200.0000 mg | ORAL_TABLET | Freq: Once | ORAL | Status: AC
  Administered 2022-03-28: 200 mg via ORAL
  Filled 2022-03-28: qty 1

## 2022-03-28 NOTE — ED Notes (Signed)
Pt asking for something to eat and drink. Pt made aware that we need provider okay first. Verbalized understanding. Provider with another pt at this time.

## 2022-03-28 NOTE — ED Notes (Signed)
Provider made aware that pt is req food and drink; provider states he will need to see him prior to eating and drinking.

## 2022-03-28 NOTE — ED Provider Notes (Signed)
[facility] Provider Note: Georgena Spurling, MD, FACEP  CSN: [id number] MRN: [id number] ARRIVAL: 03/27/22 at 2057 ROOM: [place]  Headache   HISTORY OF PRESENT ILLNESS  03/28/22 2:00 AM George Sanders. is a 31 y.o. male with 2 days of a sharp right-sided headache above his right ear.  The pain is intermittent and throbbing, lasting in paroxysms of about 20 seconds.  It is worse than typical headaches and he rates it as a 10 out of 10.  When it occurs it makes his right periorbital area feel heavy although he has not noticed actual droop or visual changes.  He does not have associated nausea or vomiting.  The pain is sometimes brought on by turning his neck.   Past Medical History:  Diagnosis Date   Diabetes mellitus without complication (HCC)    PNA (pneumonia)     History reviewed. No pertinent surgical history.  Family History  Problem Relation Age of Onset   Diabetes Mellitus II Mother    Diabetes Mellitus II Sister     Social History   Tobacco Use   Smoking status: Former    Types: Cigars   Smokeless tobacco: Never  Vaping Use   Vaping Use: Never used  Substance Use Topics   Alcohol use: Yes    Comment: occasionally   Drug use: Never    Prior to Admission medications   Medication Sig Start Date End Date Taking? Authorizing Provider  carbamazepine (TEGRETOL) 200 MG tablet 1 tablet 3 times a day.  May increase to 2 tablets 3 times a day if inadequate relief is obtained. 03/28/22  Yes Lakina Mcintire, MD  aspirin EC 81 MG EC tablet Take 1 tablet (81 mg total) by mouth daily. 05/03/19   Lavina Hamman, MD  atorvastatin (LIPITOR) 20 MG tablet Take 1 tablet (20 mg total) by mouth daily at 6 PM. 10/26/19 10/25/20  Georgette Shell, MD  blood glucose meter kit and supplies Dispense based on patient and insurance preference. Use up to four times daily as directed. (FOR ICD-10 E10.9, E11.9). 05/02/19   Lavina Hamman, MD  insulin isophane &  regular human (NOVOLIN 70/30 FLEXPEN RELION) (70-30) 100 UNIT/ML KwikPen Inject 30 Units into the skin 2 (two) times daily before a meal. 10/26/19   Georgette Shell, MD  Insulin Pen Needle 31G X 5 MM MISC 1 Act by Does not apply route 2 (two) times daily before a meal. 10/26/19   Georgette Shell, MD  lisinopril (ZESTRIL) 5 MG tablet Take 1 tablet (5 mg total) by mouth daily. 10/26/19   Georgette Shell, MD  metoprolol tartrate (LOPRESSOR) 50 MG tablet Take 1 tablet (50 mg total) by mouth 2 (two) times daily. 05/02/19   Lavina Hamman, MD  polyethylene glycol (MIRALAX / GLYCOLAX) 17 g packet Take 17 g by mouth daily as needed. Patient taking differently: Take 17 g by mouth daily as needed for mild constipation or moderate constipation.  05/02/19   Lavina Hamman, MD    Allergies Patient has no known allergies.   REVIEW OF SYSTEMS  Negative except as noted here or in the History of Present Illness.   PHYSICAL EXAMINATION  Initial Vital Signs Blood pressure (!) 147/95, pulse 100, temperature 98.6 F (37 C), temperature source Oral, resp. rate 20, height 6' (1.829 m), weight (S) (!) 181.4 kg, SpO2 98 %.  Examination General: Well-developed, high BMI male in no acute distress; appearance consistent  with age of record HENT: normocephalic; atraumatic; scalp nontender Eyes: pupils equal, round and reactive to light; extraocular muscles intact; no ptosis Neck: supple Heart: regular rate and rhythm Lungs: clear to auscultation bilaterally Abdomen: soft; nondistended; nontender; bowel sounds present Extremities: No deformity; full range of motion Neurologic: Awake, alert and oriented; motor function intact in all extremities and symmetric; no facial droop Skin: Warm and dry Psychiatric: Normal mood and affect   RESULTS  Summary of this visit's results, reviewed and interpreted by myself:   EKG Interpretation  Date/Time:    Ventricular Rate:    PR Interval:    QRS Duration:    QT Interval:    QTC Calculation:   R Axis:     Text Interpretation:         Laboratory Studies: No results found for this or any previous visit (from the past 24 hour(s)). Imaging Studies: CT Head Wo Contrast  Result Date: 03/27/2022 CLINICAL DATA:  Headache EXAM: CT HEAD WITHOUT CONTRAST TECHNIQUE: Contiguous axial images were obtained from the base of the skull through the vertex without intravenous contrast. RADIATION DOSE REDUCTION: This exam was performed according to the departmental dose-optimization program which includes automated exposure control, adjustment of the mA and/or kV according to patient size and/or use of iterative reconstruction technique. COMPARISON:  None Available. FINDINGS: Brain: No evidence of acute infarction, hemorrhage, hydrocephalus, extra-axial collection or mass lesion/mass effect. Vascular: No hyperdense vessel or unexpected calcification. Skull: Normal. Negative for fracture or focal lesion. Sinuses/Orbits: The visualized paranasal sinuses are essentially clear. The mastoid air cells are unopacified. Other: None. IMPRESSION: Normal head CT. Electronically Signed   By: Julian Hy M.D.   On: 03/27/2022 21:50    ED COURSE and MDM  Nursing notes, initial and subsequent vitals signs, including pulse oximetry, reviewed and interpreted by myself.  Vitals:   03/27/22 2111 03/27/22 2114 03/28/22 0119  BP: (!) 141/92  (!) 147/95  Pulse: (!) 109  100  Resp: 18  20  Temp: 98.9 F (37.2 C)  98.6 F (37 C)  TempSrc:   Oral  SpO2: 98%  98%  Weight:  (S) (!) 181.4 kg   Height:  6' (1.829 m)    Medications  carbamazepine (TEGRETOL) tablet 200 mg (has no administration in time range)    The patient's symptoms are suspicious for occipital neuralgia involving the lesser occipital nerve.  The location of the pain is included in the distribution of the posterior auricular nerve.  The transient, sudden onset and sharp nature of the pain also suggest  neuralgia.  We will start him on carbamazepine and refer back to his PCP if symptoms persist.  PROCEDURES  Procedures   ED DIAGNOSES     ICD-10-CM   1. Occipital neuralgia of right side  M54.81          Lamesha Tibbits, Jenny Reichmann, MD 03/28/22 0210

## 2024-03-20 ENCOUNTER — Encounter (HOSPITAL_BASED_OUTPATIENT_CLINIC_OR_DEPARTMENT_OTHER): Payer: Self-pay

## 2024-03-20 ENCOUNTER — Emergency Department (HOSPITAL_BASED_OUTPATIENT_CLINIC_OR_DEPARTMENT_OTHER)
Admission: EM | Admit: 2024-03-20 | Discharge: 2024-03-20 | Disposition: A | Discharge status: Home / Self Care | Payer: PRIVATE HEALTH INSURANCE | Attending: Emergency Medicine | Admitting: Emergency Medicine

## 2024-03-20 ENCOUNTER — Other Ambulatory Visit: Payer: Self-pay

## 2024-03-20 DIAGNOSIS — R21 Rash and other nonspecific skin eruption: Secondary | ICD-10-CM | POA: Insufficient documentation

## 2024-03-20 DIAGNOSIS — Z7982 Long term (current) use of aspirin: Secondary | ICD-10-CM | POA: Insufficient documentation

## 2024-03-20 DIAGNOSIS — R300 Dysuria: Secondary | ICD-10-CM | POA: Insufficient documentation

## 2024-03-20 LAB — URINALYSIS, MICROSCOPIC (REFLEX)

## 2024-03-20 LAB — URINALYSIS, ROUTINE W REFLEX MICROSCOPIC
Bilirubin Urine: NEGATIVE
Glucose, UA: >=500 mg/dL — AB
Ketones, ur: NEGATIVE mg/dL
Leukocytes,Ua: NEGATIVE
Nitrite: NEGATIVE
Protein, ur: NEGATIVE mg/dL
Specific Gravity, Urine: >=1.03 (ref 1.005–1.030)
pH: 6 (ref 5.0–8.0)

## 2024-03-20 MED ORDER — CIPROFLOXACIN HCL 500 MG PO TABS: 500.0000 mg | ORAL_TABLET | Freq: Two times a day (BID) | ORAL | 0 refills | Status: AC

## 2024-03-20 MED ORDER — PHENAZOPYRIDINE HCL 100 MG PO TABS
200.0000 mg | ORAL_TABLET | Freq: Once | ORAL | Status: AC
  Administered 2024-03-20: 200 mg via ORAL
  Filled 2024-03-20: qty 2

## 2024-03-20 MED ORDER — PHENAZOPYRIDINE HCL 200 MG PO TABS
200.0000 mg | ORAL_TABLET | Freq: Three times a day (TID) | ORAL | 0 refills | Status: AC
Start: 2024-03-20 — End: ?

## 2024-03-20 MED ORDER — CIPROFLOXACIN HCL 500 MG PO TABS
500.0000 mg | ORAL_TABLET | Freq: Once | ORAL | Status: AC
  Administered 2024-03-20: 500 mg via ORAL
  Filled 2024-03-20: qty 1

## 2024-03-20 NOTE — ED Triage Notes (Signed)
 Pt states he is experiencing penile pain when he voids and around the tip.  Pt states it red.  Pt not concerned about STDs

## 2024-03-20 NOTE — ED Notes (Signed)
RN provided AVS using Teachback Method. Patient verbalizes understanding of Discharge Instructions. Opportunity for Questioning and Answers were provided by RN. Patient Discharged from ED ambulatory to home. ° °

## 2024-03-20 NOTE — ED Provider Notes (Signed)
 [facility] Provider Note   CSN: [id number] Arrival date & time: 03/20/24  9985     Patient presents with: Penis Pain   George Sanders. is a 33 y.o. male.   Patient is a 33 year old male presenting with complaints of dysuria and rash near his genitals.  Patient denies any fevers or chills.  Symptoms have been ongoing for the past 4 days.  He is sexually active with his wife in a monogamous relationship and denies the possibility of STD.  He does state that he notices a small amount of blood after he voids.       Prior to Admission medications   Medication Sig Start Date End Date Taking? Authorizing Provider  aspirin  EC 81 MG EC tablet Take 1 tablet (81 mg total) by mouth daily. 05/03/19   Tobie Yetta HERO, MD  atorvastatin  (LIPITOR) 20 MG tablet Take 1 tablet (20 mg total) by mouth daily at 6 PM. 10/26/19 10/25/20  Will Almarie MATSU, MD  blood glucose meter kit and supplies Dispense based on patient and insurance preference. Use up to four times daily as directed. (FOR ICD-10 E10.9, E11.9). 05/02/19   Patel, Pranav M, MD  carbamazepine  (TEGRETOL ) 200 MG tablet 1 tablet 3 times a day.  May increase to 2 tablets 3 times a day if inadequate relief is obtained. 03/28/22   Molpus, John, MD  insulin  isophane & regular human (NOVOLIN  70/30 FLEXPEN RELION) (70-30) 100 UNIT/ML KwikPen Inject 30 Units into the skin 2 (two) times daily before a meal. 10/26/19   Will Almarie MATSU, MD  Insulin  Pen Needle 31G X 5 MM MISC 1 Act by Does not apply route 2 (two) times daily before a meal. 10/26/19   Will Almarie MATSU, MD  lisinopril  (ZESTRIL ) 5 MG tablet Take 1 tablet (5 mg total) by mouth daily. 10/26/19   Will Almarie MATSU, MD  metoprolol  tartrate (LOPRESSOR ) 50 MG tablet Take 1 tablet (50 mg total) by mouth 2 (two) times daily. 05/02/19   Patel, Pranav M, MD  polyethylene glycol (MIRALAX  / GLYCOLAX ) 17 g packet Take 17 g by mouth daily as  needed. Patient taking differently: Take 17 g by mouth daily as needed for mild constipation or moderate constipation.  05/02/19   Tobie Yetta HERO, MD    Allergies: Patient has no known allergies.    Review of Systems  All other systems reviewed and are negative.   Updated Vital Signs BP 116/66 (BP Location: Right Arm)   Pulse (!) 107   Temp 97.8 F (36.6 C) (Oral)   Resp 18   Ht 6' (1.829 m)   Wt (!) 172.4 kg   SpO2 96%   BMI 51.54 kg/m   Physical Exam Vitals and nursing note reviewed.  Constitutional:      Appearance: Normal appearance.  Pulmonary:     Effort: Pulmonary effort is normal.  Genitourinary:    Penis: Normal.   Neurological:     Mental Status: He is alert and oriented to person, place, and time.     (all labs ordered are listed, but only abnormal results are displayed) Labs Reviewed  URINALYSIS, ROUTINE W REFLEX MICROSCOPIC - Abnormal; Notable for the following components:      Result Value   Glucose, UA >=500 (*)    Hgb urine dipstick TRACE (*)    All other components within normal limits  URINALYSIS, MICROSCOPIC (REFLEX) - Abnormal; Notable for the following components:   Bacteria, UA  RARE (*)    All other components within normal limits    EKG: None  Radiology: No results found.   Procedures   Medications Ordered in the ED  ciprofloxacin  (CIPRO ) tablet 500 mg (has no administration in time range)  phenazopyridine  (PYRIDIUM ) tablet 200 mg (has no administration in time range)                                    Medical Decision Making Amount and/or Complexity of Data Reviewed Labs: ordered.  Risk Prescription drug management.   Patient presenting with dysuria, but urinalysis that is unremarkable.  Given his symptomatology, I feel as though a trial course of antibiotics is reasonable.  He will be given Cipro  as well as Pyridium .  If he is not improving in the next few days, I will have him follow-up with primary doctor or return if  he worsens.     Final diagnoses:  None    ED Discharge Orders     None          Geroldine Berg, MD 03/20/24 [id number]

## 2024-03-20 NOTE — Discharge Instructions (Signed)
 Begin taking Cipro  and Pyridium  as prescribed.  Follow-up with primary doctor if not improving in the next few days, and return to the ER if your symptoms significantly worsen or change.

## 2024-03-21 LAB — GC/CHLAMYDIA PROBE AMP (~~LOC~~) NOT AT ARMC
Chlamydia: NEGATIVE
Comment: NEGATIVE
Comment: NORMAL
Neisseria Gonorrhea: NEGATIVE
